# Patient Record
Sex: Female | Born: 1945 | Race: White | Hispanic: No | Smoking: Former smoker
Health system: Southern US, Community
[De-identification: ages and names within clinical notes are randomized; demographics above are authoritative.]

## PROBLEM LIST (undated history)

## (undated) DIAGNOSIS — M810 Age-related osteoporosis without current pathological fracture: Secondary | ICD-10-CM

## (undated) DIAGNOSIS — F419 Anxiety disorder, unspecified: Secondary | ICD-10-CM

## (undated) DIAGNOSIS — E049 Nontoxic goiter, unspecified: Secondary | ICD-10-CM

## (undated) DIAGNOSIS — I10 Essential (primary) hypertension: Secondary | ICD-10-CM

## (undated) DIAGNOSIS — L409 Psoriasis, unspecified: Secondary | ICD-10-CM

## (undated) DIAGNOSIS — J45909 Unspecified asthma, uncomplicated: Secondary | ICD-10-CM

## (undated) DIAGNOSIS — K649 Unspecified hemorrhoids: Secondary | ICD-10-CM

## (undated) DIAGNOSIS — J449 Chronic obstructive pulmonary disease, unspecified: Secondary | ICD-10-CM

## (undated) DIAGNOSIS — E785 Hyperlipidemia, unspecified: Secondary | ICD-10-CM

## (undated) HISTORY — DX: Unspecified asthma, uncomplicated: J45.909

## (undated) HISTORY — DX: Essential (primary) hypertension: I10

## (undated) HISTORY — DX: Chronic obstructive pulmonary disease, unspecified: J44.9

## (undated) HISTORY — DX: Psoriasis, unspecified: L40.9

## (undated) HISTORY — DX: Nontoxic goiter, unspecified: E04.9

## (undated) HISTORY — DX: Age-related osteoporosis without current pathological fracture: M81.0

## (undated) HISTORY — DX: Hyperlipidemia, unspecified: E78.5

## (undated) HISTORY — DX: Unspecified hemorrhoids: K64.9

## (undated) HISTORY — PX: TONSILLECTOMY: SUR1361

## (undated) HISTORY — DX: Anxiety disorder, unspecified: F41.9

---
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
## 1980-10-01 HISTORY — PX: OTHER SURGICAL HISTORY: SHX169

## 1988-10-01 HISTORY — PX: OTHER SURGICAL HISTORY: SHX169

## 2011-12-01 LAB — BASIC METABOLIC PANEL
BUN: 20 mg/dL (ref 4–21)
Creatinine: 0.8 mg/dL (ref 0.5–1.1)
Glucose: 90 mg/dL
Potassium: 4.5 mmol/L (ref 3.4–5.3)
Sodium: 140 mmol/L (ref 137–147)

## 2011-12-01 LAB — CBC AND DIFFERENTIAL: WBC: 5.2 10^3/mL

## 2011-12-01 LAB — HEPATIC FUNCTION PANEL
ALT: 23 U/L (ref 7–35)
AST: 13 U/L (ref 13–35)

## 2012-06-04 LAB — HEPATIC FUNCTION PANEL
ALT: 21 U/L (ref 7–35)
AST: 12 U/L — AB (ref 13–35)
Alkaline Phosphatase: 66 U/L (ref 25–125)
Bilirubin, Total: 0.5 mg/dL

## 2012-06-04 LAB — CBC AND DIFFERENTIAL
HCT: 44 % (ref 36–46)
WBC: 5.5 10^3/mL

## 2012-06-04 LAB — BASIC METABOLIC PANEL
BUN: 21 mg/dL (ref 4–21)
Glucose: 96 mg/dL
Sodium: 140 mmol/L (ref 137–147)

## 2012-06-04 LAB — LIPID PANEL: LDL Cholesterol: 155 mg/dL

## 2012-06-27 LAB — HM MAMMOGRAPHY

## 2012-12-11 LAB — CBC AND DIFFERENTIAL: Platelets: 232 10*3/uL (ref 150–399)

## 2012-12-11 LAB — HEPATIC FUNCTION PANEL
ALT: 19 U/L (ref 7–35)
AST: 13 U/L (ref 13–35)

## 2012-12-11 LAB — BASIC METABOLIC PANEL
Glucose: 99 mg/dL
Sodium: 143 mmol/L (ref 137–147)

## 2012-12-11 LAB — LIPID PANEL
HDL: 56 mg/dL (ref 35–70)
LDL Cholesterol: 123 mg/dL

## 2013-05-21 ENCOUNTER — Telehealth: Payer: Self-pay | Admitting: Internal Medicine

## 2013-05-21 NOTE — Telephone Encounter (Signed)
rec'd reords from [facility], [facility] [address] to [employer]

## 2013-07-23 ENCOUNTER — Inpatient Hospital Stay (HOSPITAL_COMMUNITY)
Admission: EM | Admit: 2013-07-23 | Discharge: 2013-07-29 | DRG: 189 | Disposition: A | Discharge status: Home / Self Care | Payer: Medicare Other | Attending: Internal Medicine | Admitting: Internal Medicine

## 2013-07-23 ENCOUNTER — Encounter (HOSPITAL_COMMUNITY): Payer: Self-pay | Admitting: Emergency Medicine

## 2013-07-23 ENCOUNTER — Emergency Department (HOSPITAL_COMMUNITY): Payer: Medicare Other

## 2013-07-23 DIAGNOSIS — I50810 Right heart failure, unspecified: Secondary | ICD-10-CM

## 2013-07-23 DIAGNOSIS — F17200 Nicotine dependence, unspecified, uncomplicated: Secondary | ICD-10-CM | POA: Diagnosis present

## 2013-07-23 DIAGNOSIS — F411 Generalized anxiety disorder: Secondary | ICD-10-CM | POA: Diagnosis present

## 2013-07-23 DIAGNOSIS — E785 Hyperlipidemia, unspecified: Secondary | ICD-10-CM | POA: Diagnosis present

## 2013-07-23 DIAGNOSIS — J441 Chronic obstructive pulmonary disease with (acute) exacerbation: Secondary | ICD-10-CM

## 2013-07-23 DIAGNOSIS — J449 Chronic obstructive pulmonary disease, unspecified: Secondary | ICD-10-CM | POA: Diagnosis present

## 2013-07-23 DIAGNOSIS — R0902 Hypoxemia: Secondary | ICD-10-CM

## 2013-07-23 DIAGNOSIS — I279 Pulmonary heart disease, unspecified: Secondary | ICD-10-CM | POA: Diagnosis present

## 2013-07-23 DIAGNOSIS — Z23 Encounter for immunization: Secondary | ICD-10-CM

## 2013-07-23 DIAGNOSIS — J9620 Acute and chronic respiratory failure, unspecified whether with hypoxia or hypercapnia: Principal | ICD-10-CM | POA: Diagnosis present

## 2013-07-23 DIAGNOSIS — R7989 Other specified abnormal findings of blood chemistry: Secondary | ICD-10-CM | POA: Diagnosis present

## 2013-07-23 DIAGNOSIS — M539 Dorsopathy, unspecified: Secondary | ICD-10-CM | POA: Diagnosis present

## 2013-07-23 DIAGNOSIS — I2781 Cor pulmonale (chronic): Secondary | ICD-10-CM | POA: Diagnosis present

## 2013-07-23 DIAGNOSIS — I10 Essential (primary) hypertension: Secondary | ICD-10-CM | POA: Diagnosis present

## 2013-07-23 DIAGNOSIS — E872 Acidosis, unspecified: Secondary | ICD-10-CM | POA: Diagnosis present

## 2013-07-23 DIAGNOSIS — E049 Nontoxic goiter, unspecified: Secondary | ICD-10-CM | POA: Diagnosis present

## 2013-07-23 DIAGNOSIS — I2510 Atherosclerotic heart disease of native coronary artery without angina pectoris: Secondary | ICD-10-CM | POA: Diagnosis present

## 2013-07-23 DIAGNOSIS — G8929 Other chronic pain: Secondary | ICD-10-CM | POA: Diagnosis present

## 2013-07-23 DIAGNOSIS — M549 Dorsalgia, unspecified: Secondary | ICD-10-CM | POA: Diagnosis present

## 2013-07-23 DIAGNOSIS — R0689 Other abnormalities of breathing: Secondary | ICD-10-CM

## 2013-07-23 DIAGNOSIS — Z79899 Other long term (current) drug therapy: Secondary | ICD-10-CM

## 2013-07-23 DIAGNOSIS — D45 Polycythemia vera: Secondary | ICD-10-CM | POA: Diagnosis present

## 2013-07-23 DIAGNOSIS — Z7982 Long term (current) use of aspirin: Secondary | ICD-10-CM

## 2013-07-23 DIAGNOSIS — E871 Hypo-osmolality and hyponatremia: Secondary | ICD-10-CM | POA: Diagnosis present

## 2013-07-23 DIAGNOSIS — J9621 Acute and chronic respiratory failure with hypoxia: Secondary | ICD-10-CM | POA: Diagnosis present

## 2013-07-23 LAB — BLOOD GAS, ARTERIAL
Bicarbonate: 26.2 mEq/L — ABNORMAL HIGH (ref 20.0–24.0)
Expiratory PAP: 5
FIO2: 0.6 %
O2 Saturation: 98.7 %
Patient temperature: 98.7
TCO2: 28.1 mmol/L (ref 0–100)
pCO2 arterial: 61.7 mmHg (ref 35.0–45.0)
pH, Arterial: 7.252 — ABNORMAL LOW (ref 7.350–7.450)
pO2, Arterial: 149 mmHg — ABNORMAL HIGH (ref 80.0–100.0)

## 2013-07-23 LAB — CBC WITH DIFFERENTIAL/PLATELET
Basophils Absolute: 0 10*3/uL (ref 0.0–0.1)
Eosinophils Absolute: 0 10*3/uL (ref 0.0–0.7)
HCT: 44.7 % (ref 36.0–46.0)
Lymphs Abs: 1 10*3/uL (ref 0.7–4.0)
MCH: 33.5 pg (ref 26.0–34.0)
MCHC: 36.5 g/dL — ABNORMAL HIGH (ref 30.0–36.0)
MCV: 91.8 fL (ref 78.0–100.0)
Monocytes Absolute: 0.7 10*3/uL (ref 0.1–1.0)
Monocytes Relative: 12 % (ref 3–12)
Neutro Abs: 3.9 10*3/uL (ref 1.7–7.7)
Platelets: 168 10*3/uL (ref 150–400)
RDW: 13.2 % (ref 11.5–15.5)
WBC: 5.6 10*3/uL (ref 4.0–10.5)

## 2013-07-23 LAB — POCT I-STAT 3, ART BLOOD GAS (G3+)
O2 Saturation: 96 %
pCO2 arterial: 78.7 mmHg (ref 35.0–45.0)
pH, Arterial: 7.22 — ABNORMAL LOW (ref 7.350–7.450)
pO2, Arterial: 100 mmHg (ref 80.0–100.0)

## 2013-07-23 LAB — CBC
HCT: 44.5 % (ref 36.0–46.0)
Hemoglobin: 15.5 g/dL — ABNORMAL HIGH (ref 12.0–15.0)
MCH: 32.4 pg (ref 26.0–34.0)
MCV: 93.1 fL (ref 78.0–100.0)
RBC: 4.78 MIL/uL (ref 3.87–5.11)

## 2013-07-23 LAB — TROPONIN I
Troponin I: 0.54 ng/mL (ref <0.30)
Troponin I: 1.07 ng/mL (ref <0.30)

## 2013-07-23 LAB — POCT I-STAT, CHEM 8
BUN: 16 mg/dL (ref 6–23)
Calcium, Ion: 1.1 mmol/L — ABNORMAL LOW (ref 1.13–1.30)
Chloride: 90 mEq/L — ABNORMAL LOW (ref 96–112)
Glucose, Bld: 117 mg/dL — ABNORMAL HIGH (ref 70–99)
HCT: 53 % — ABNORMAL HIGH (ref 36.0–46.0)
Potassium: 4 mEq/L (ref 3.5–5.1)
TCO2: 31 mmol/L (ref 0–100)

## 2013-07-23 LAB — MRSA PCR SCREENING: MRSA by PCR: NEGATIVE

## 2013-07-23 MED ORDER — ALUM & MAG HYDROXIDE-SIMETH 200-200-20 MG/5ML PO SUSP: 30.0000 mL | Freq: Four times a day (QID) | ORAL | Status: DC | PRN

## 2013-07-23 MED ORDER — PREDNISONE 20 MG PO TABS: 60.0000 mg | ORAL_TABLET | Freq: Once | ORAL | Status: DC

## 2013-07-23 MED ORDER — ENOXAPARIN SODIUM 40 MG/0.4ML ~~LOC~~ SOLN
40.0000 mg | SUBCUTANEOUS | Status: DC
  Administered 2013-07-23: 40 mg via SUBCUTANEOUS
  Filled 2013-07-23: qty 0.4

## 2013-07-23 MED ORDER — ALBUTEROL (5 MG/ML) CONTINUOUS INHALATION SOLN
10.0000 mg/h | INHALATION_SOLUTION | Freq: Once | RESPIRATORY_TRACT | Status: AC
  Administered 2013-07-23: 10 mg/h via RESPIRATORY_TRACT
  Filled 2013-07-23: qty 20

## 2013-07-23 MED ORDER — ALBUTEROL SULFATE (5 MG/ML) 0.5% IN NEBU
5.0000 mg | INHALATION_SOLUTION | RESPIRATORY_TRACT | Status: DC
  Administered 2013-07-23 (×2): 5 mg via RESPIRATORY_TRACT
  Filled 2013-07-23: qty 1

## 2013-07-23 MED ORDER — ALBUTEROL SULFATE (5 MG/ML) 0.5% IN NEBU: 2.5000 mg | INHALATION_SOLUTION | RESPIRATORY_TRACT | Status: DC | PRN

## 2013-07-23 MED ORDER — ALBUTEROL SULFATE (5 MG/ML) 0.5% IN NEBU: 2.5000 mg | INHALATION_SOLUTION | RESPIRATORY_TRACT | Status: DC

## 2013-07-23 MED ORDER — ALBUTEROL SULFATE (5 MG/ML) 0.5% IN NEBU
2.5000 mg | INHALATION_SOLUTION | RESPIRATORY_TRACT | Status: DC
  Filled 2013-07-23: qty 0.5

## 2013-07-23 MED ORDER — SODIUM CHLORIDE 0.9 % IV SOLN
INTRAVENOUS | Status: AC
  Administered 2013-07-23: 16:00:00 via INTRAVENOUS

## 2013-07-23 MED ORDER — ONDANSETRON HCL 4 MG/2ML IJ SOLN: 4.0000 mg | Freq: Three times a day (TID) | INTRAMUSCULAR | Status: DC | PRN

## 2013-07-23 MED ORDER — ACETAMINOPHEN 650 MG RE SUPP: 650.0000 mg | Freq: Four times a day (QID) | RECTAL | Status: DC | PRN

## 2013-07-23 MED ORDER — ASPIRIN 300 MG RE SUPP
300.0000 mg | Freq: Every day | RECTAL | Status: DC
  Administered 2013-07-23: 300 mg via RECTAL
  Filled 2013-07-23 (×2): qty 1

## 2013-07-23 MED ORDER — ACETAMINOPHEN 325 MG PO TABS: 650.0000 mg | ORAL_TABLET | Freq: Four times a day (QID) | ORAL | Status: DC | PRN

## 2013-07-23 MED ORDER — IPRATROPIUM BROMIDE 0.02 % IN SOLN: 0.5000 mg | RESPIRATORY_TRACT | Status: DC

## 2013-07-23 MED ORDER — DEXTROSE 5 % IV SOLN
500.0000 mg | INTRAVENOUS | Status: DC
  Administered 2013-07-24 – 2013-07-25 (×2): 500 mg via INTRAVENOUS
  Filled 2013-07-23 (×3): qty 500

## 2013-07-23 MED ORDER — IPRATROPIUM BROMIDE 0.02 % IN SOLN
0.5000 mg | RESPIRATORY_TRACT | Status: DC
  Filled 2013-07-23: qty 2.5

## 2013-07-23 MED ORDER — METHYLPREDNISOLONE SODIUM SUCC 125 MG IJ SOLR
125.0000 mg | Freq: Once | INTRAMUSCULAR | Status: AC
  Administered 2013-07-23: 125 mg via INTRAVENOUS
  Filled 2013-07-23: qty 2

## 2013-07-23 MED ORDER — METHYLPREDNISOLONE SODIUM SUCC 125 MG IJ SOLR
80.0000 mg | Freq: Four times a day (QID) | INTRAMUSCULAR | Status: DC
  Filled 2013-07-23 (×3): qty 1.28

## 2013-07-23 MED ORDER — ONDANSETRON HCL 4 MG/2ML IJ SOLN: 4.0000 mg | Freq: Four times a day (QID) | INTRAMUSCULAR | Status: DC | PRN

## 2013-07-23 MED ORDER — HEPARIN (PORCINE) IN NACL 100-0.45 UNIT/ML-% IJ SOLN
850.0000 [IU]/h | INTRAMUSCULAR | Status: DC
  Administered 2013-07-23: 850 [IU]/h via INTRAVENOUS
  Filled 2013-07-23 (×2): qty 250

## 2013-07-23 MED ORDER — METHYLPREDNISOLONE SODIUM SUCC 125 MG IJ SOLR
80.0000 mg | Freq: Four times a day (QID) | INTRAMUSCULAR | Status: DC
  Administered 2013-07-23 – 2013-07-25 (×8): 80 mg via INTRAVENOUS
  Filled 2013-07-23 (×8): qty 1.28

## 2013-07-23 MED ORDER — SENNOSIDES-DOCUSATE SODIUM 8.6-50 MG PO TABS
1.0000 | ORAL_TABLET | Freq: Every evening | ORAL | Status: DC | PRN
  Filled 2013-07-23: qty 1

## 2013-07-23 MED ORDER — HEPARIN BOLUS VIA INFUSION
1500.0000 [IU] | Freq: Once | INTRAVENOUS | Status: AC
  Administered 2013-07-23: 1500 [IU] via INTRAVENOUS
  Filled 2013-07-23: qty 1500

## 2013-07-23 MED ORDER — DEXTROSE 5 % IV SOLN
1.0000 g | Freq: Once | INTRAVENOUS | Status: AC
  Administered 2013-07-23: 1 g via INTRAVENOUS
  Filled 2013-07-23: qty 10

## 2013-07-23 MED ORDER — GUAIFENESIN ER 600 MG PO TB12
1200.0000 mg | ORAL_TABLET | Freq: Two times a day (BID) | ORAL | Status: DC
  Administered 2013-07-23 – 2013-07-27 (×8): 1200 mg via ORAL
  Filled 2013-07-23 (×9): qty 2

## 2013-07-23 MED ORDER — ONDANSETRON HCL 4 MG PO TABS: 4.0000 mg | ORAL_TABLET | Freq: Four times a day (QID) | ORAL | Status: DC | PRN

## 2013-07-23 MED ORDER — IPRATROPIUM BROMIDE 0.02 % IN SOLN
0.5000 mg | RESPIRATORY_TRACT | Status: DC
  Administered 2013-07-23 (×2): 0.5 mg via RESPIRATORY_TRACT
  Filled 2013-07-23: qty 2.5

## 2013-07-23 MED ORDER — AZITHROMYCIN 250 MG PO TABS
500.0000 mg | ORAL_TABLET | Freq: Once | ORAL | Status: AC
  Administered 2013-07-23: 500 mg via ORAL
  Filled 2013-07-23: qty 2

## 2013-07-23 MED ORDER — SODIUM CHLORIDE 0.9 % IJ SOLN
3.0000 mL | Freq: Two times a day (BID) | INTRAMUSCULAR | Status: DC
  Administered 2013-07-24 – 2013-07-28 (×9): 3 mL via INTRAVENOUS

## 2013-07-23 MED ORDER — IPRATROPIUM BROMIDE 0.02 % IN SOLN: 0.5000 mg | RESPIRATORY_TRACT | Status: DC | PRN

## 2013-07-23 MED ORDER — ASPIRIN EC 325 MG PO TBEC: 325.0000 mg | DELAYED_RELEASE_TABLET | Freq: Every day | ORAL | Status: DC

## 2013-07-23 NOTE — ED Notes (Signed)
Pt placed on a venturi mask at 9 liters with 35 % O2. Morgan Doom, PA at the bedside. Pt denying any pain except in her lungs

## 2013-07-23 NOTE — Progress Notes (Signed)
ANTICOAGULATION CONSULT NOTE - Initial Consult  Pharmacy Consult for Heparin Indication: chest pain/ACS  Allergies  Allergen Reactions  . Bee Venom     Bee sting  . Levaquin [Levofloxacin In D5w] Other (See Comments)    unknown    Patient Measurements: Height: 5\' 6"  (167.6 cm) Weight: 154 lb 5.2 oz (70 kg) IBW/kg (Calculated) : 59.3 Heparin Dosing Weight: 70kg  Vital Signs: Temp: 98.7 F (37.1 C) (10/23 1507) Temp src: Oral (10/23 1507) BP: 132/72 mmHg (10/23 1609) Pulse Rate: 120 (10/23 1609)  Labs:  Recent Labs  07/23/13 1139 07/23/13 1146 07/23/13 1716  HGB 16.3* 18.0* 15.5*  HCT 44.7 53.0* 44.5  PLT 168  --  166  CREATININE  --  0.90 0.65  TROPONINI  --   --  0.54*    Estimated Creatinine Clearance: 63 ml/min (by C-G formula based on Cr of 0.65).   Medical History: Past Medical History  Diagnosis Date  . Asthma   . History of chicken pox   . Back problem   . Goiter   . Hemorrhoids   . COPD (chronic obstructive pulmonary disease)   . Hypertension   . Cardiovascular disease   . Hyperlipidemia   . Psoriasis     Medications:  Prescriptions prior to admission  Medication Sig Dispense Refill  . albuterol (PROVENTIL HFA;VENTOLIN HFA) 108 (90 BASE) MCG/ACT inhaler Inhale 2 puffs into the lungs every 4 (four) hours as needed for wheezing.      Marland Kitchen EPINEPHrine (EPI-PEN) 0.3 mg/0.3 mL SOAJ injection Inject 0.3 mg into the muscle once.      . Fluticasone-Salmeterol (ADVAIR) 500-50 MCG/DOSE AEPB Inhale 1 puff into the lungs every 12 (twelve) hours.      Marland Kitchen lisinopril (PRINIVIL,ZESTRIL) 20 MG tablet Take 20 mg by mouth daily.      . Tetrahydrozoline HCl (EYE DROPS OP) Place 1 drop into both eyes daily as needed (dry eyes).        Assessment: 67yof to begin heparin for elevated troponin/ACS. Patient received Lovenox 40mg  SQ ~1630 - will give reduced bolus. - H/H and Plts wnl - No significant bleeding reported - Baseline INR ordered  Goal of Therapy:   Heparin level 0.3-0.7 units/ml Monitor platelets by anticoagulation protocol: Yes   Plan:  1. Discontinue Lovenox 2. Heparin IV bolus 1500 units x 1 3. Heparin drip 850 units/hr (8.5 ml/hr) 4. Check heparin level, INR and CBC at 0300 - consider Lovenox effects when interpreting level 5. Daily heparin level and CBC  Cleon Dew [phone] 07/23/2013,7:32 PM

## 2013-07-23 NOTE — Progress Notes (Signed)
Utilization review completed.  

## 2013-07-23 NOTE — Progress Notes (Signed)
Troponins elevated, EKG-sinus tach-spoke with Dr Jearld Pies- Cards on Call- to monitor and notify cards if enzymes continue to increase. In interim, will start ASA and Heparin.

## 2013-07-23 NOTE — Progress Notes (Addendum)
Shift event: Pt's 1st troponin .54, 2nd 1.07. Pt is having NO chest pain and is already on ASA and Heparin IV. Cardio fellow called to make aware. No change in tx plan at this time. Will report to oncoming am attending. Craige Cotta, NP 3rd trop down to .95. Craige Cotta, NP

## 2013-07-23 NOTE — ED Notes (Signed)
Dr. Belfi at the bedside.  

## 2013-07-23 NOTE — ED Notes (Signed)
RT called for a CAT

## 2013-07-23 NOTE — ED Notes (Signed)
Admitting at the bedside.  

## 2013-07-23 NOTE — ED Notes (Signed)
Pt finished with 2nd breathing treatment. States "it's not getting any better". MD made aware and will be back in to see patient shortly. Pt placed back on venturi mask at 15 liters/ 50 %. Sats 92 %

## 2013-07-23 NOTE — H&P (Signed)
[facility] History and Physical  Morgan [name] ZOX:[id number] DOB: 08-18-1946 DOA: 07/23/2013  Referring physician: Dr. Fredderick Phenix, EDP PCP: Rene Paci, MD    Chief Complaint: short of breath  HPI: Morgan [name] is a 67 y.o. female with a past medical history significant for COPD, continued tobacco abuse, cardiovascular disease, hypertension, hyperlipidemia, psoriasis.  She presents with acute respiratory distress and is currently on a venturi mask.  She is short of breath when speaking. She reports that her granddaughter was visiting her recently and had an upper respiratory illness.  The patient has been having progressively worsening shortness of breath over the past 3 days. She has a nonproductive cough. She reports that she quit smoking when this illness started. She has had previous COPD exacerbations, but none that required her to come to the emergency department. She is compliant in taking albuterol and Advair. She is new to town and has not yet seen her new primary care physician, Dr. [name].    The patient received 2 breathing treatments in the emergency department with no improvement.  Her chest x-ray is consistent with emphysema/COPD. Current hemoglobin is 18 and hematocrit is 53.    She is being admitted to the step down unit for acute on chronic respiratory failure with hypoxia (she had an O2 sat of 75 in the ED). An ABG is pending. She is a full code.   Systems: Patient denies chest pain, hemoptysis, sore throat, anorexia, fever, vomiting, diarrhea. Further review of systems was difficult to obtain as the patient was so short of breath.  Past Medical History  Diagnosis Date  . Asthma   . History of chicken pox   . Back problem   . Goiter   . Hemorrhoids   . COPD (chronic obstructive pulmonary disease)   . Hypertension   . Cardiovascular disease   . Hyperlipidemia   . Psoriasis    Past Surgical History  Procedure Laterality Date  . Cesarean section     Social  History:  reports that she quit smoking 8 days ago. She does not have any smokeless tobacco history on file. She reports that she drinks alcohol. She reports that she does not use illicit drugs. She lives at home, and is independent with her ADLs  Allergies  Allergen Reactions  . Bee Venom     Bee sting  . Levaquin [Levofloxacin In D5w] Other (See Comments)    unknown    Family History  Problem Relation Age of Onset  . Heart disease Mother   . Cancer Father    no known pulmonary difficulties in the family  Prior to Admission medications   Medication Sig Start Date End Date Taking? Authorizing Provider  albuterol (PROVENTIL HFA;VENTOLIN HFA) 108 (90 BASE) MCG/ACT inhaler Inhale 2 puffs into the lungs every 4 (four) hours as needed for wheezing.   Yes Historical Provider, MD  EPINEPHrine (EPI-PEN) 0.3 mg/0.3 mL SOAJ injection Inject 0.3 mg into the muscle once.   Yes Historical Provider, MD  Fluticasone-Salmeterol (ADVAIR) 500-50 MCG/DOSE AEPB Inhale 1 puff into the lungs every 12 (twelve) hours.   Yes Historical Provider, MD  lisinopril (PRINIVIL,ZESTRIL) 20 MG tablet Take 20 mg by mouth daily.   Yes Historical Provider, MD  Tetrahydrozoline HCl (EYE DROPS OP) Place 1 drop into both eyes daily as needed (dry eyes).   Yes Historical Provider, MD   Physical Exam: Filed Vitals:   07/23/13 1315  BP: 139/64  Pulse: 120  Temp:   Resp: 21  General:  alert and oriented, sitting up at 90, working to breathe   Eyes: Sclera clear conjunctiva pain, pupils equal and round   ENT: moist his membranes, no exudates or erythema in her oropharynx  Neck: Supple, without lymphadenopathy  Cardiovascular: Tachycardic, without obvious murmur rub or gallop   Respiratory: Diffuse wheezes bilaterally, poor air movement, positive accessory muscle use   Abdomen: Soft, nontender, nondistended, positive bowel sounds, no masses   Skin: few small petechial areas on her extremities no cuts or  bruises.   Musculoskeletal: able to move all 4 extremities, 5 over 5 strength in knee touch  Psychiatric: Alert and oriented, cooperative, well-groomed   Neurologic: cranial nerves II through XII grossly intact, no focal deficits   Labs on Admission:  Basic Metabolic Panel:  Recent Labs Lab 07/23/13 1146  NA 133*  K 4.0  CL 90*  GLUCOSE 117*  BUN 16  CREATININE 0.90   CBC:  Recent Labs Lab 07/23/13 1139 07/23/13 1146  WBC 5.6  --   NEUTROABS 3.9  --   HGB 16.3* 18.0*  HCT 44.7 53.0*  MCV 91.8  --   PLT 168  --     Radiological Exams on Admission: Dg Chest Portable 1 View  07/23/2013   CLINICAL DATA:  Shortness of breath.  EXAM: PORTABLE CHEST - 1 VIEW  COMPARISON:  None.  FINDINGS: 1145 hr. The heart size and mediastinal contours are normal. There is aortic atherosclerosis. The lungs demonstrate diffusely increased interstitial markings. There is a questionable small perihilar nodule on the left, although this could be secondary to overlap of vascular and osseous structures. No confluent airspace opacity or significant pleural effusion is seen.  IMPRESSION: Interstitial prominence without definite edema or focal airspace disease. A small nodule in the left perihilar region cannot be excluded by this examination. Follow up PA and lateral views of the chest are recommended. If this is persistent, chest CT would be indicated for further evaluation.   Electronically Signed   By: Roxy Horseman M.D.   On: 07/23/2013 12:16    EKG: Ordered   Assessment/Plan Principal Problem:   Acute and chronic respiratory failure with hypoxia Active Problems:   Hyperlipidemia   Cardiovascular disease   Hypertension   COPD (chronic obstructive pulmonary disease)   Back problem  Acute on chronic respiratory failure with hypoxia secondary to COPD exacerbation   admitted to step down.  Started on IV Solu-Medrol, scheduled nebulizers, and azithromycin, and mucolytics  May very well need  BiPAP, ABG pending  Reports that she has stopped smoking.  Consider repeat xray after she is hydrated to rule out infiltrate.  Dehydration  Elevated hemoglobin and hematocrit, may be due to dose dehydration and long-term cigarette smoking  Gentle IV fluids for 12 hours then reassess  Hypertension  Continue lisinopril and monitor   Code Status: full code Family Communication: Friend at bedside Disposition Plan: inpatient, stepdown.  Time spent: 60 min.  Conley Canal [facility] Pager [phone]  If 7PM-7AM, please contact night-coverage [email or web address] Password [facility] 07/23/2013, 2:39 PM  Attending - Patient with acute hypercarbic respiratory failure in a setting of acute exacerbation of COPD. ABG is consistent with acute hypocarbia with respiratory acidosis. Start on BiPAP, repeat ABG, PCCM consulted. Continue with Solu-Medrol, nebulized bronchodilators and IV antibiotics. If repeat ABG shows worsening hypercarbia, will likely need to be moved to the intensive care unit. We'll continue to monitor and follow clinical course very closely.  S Ghimire

## 2013-07-23 NOTE — ED Notes (Signed)
RT at the bedside to get an ABG

## 2013-07-23 NOTE — ED Notes (Signed)
[place], PA back in with pt.  

## 2013-07-23 NOTE — ED Provider Notes (Signed)
CSN: [id number]     Arrival date & time 07/23/13  1110 History   First MD Initiated Contact with Patient 07/23/13 1126     Chief Complaint  Patient presents with  . Shortness of Breath   (Consider location/radiation/quality/duration/timing/severity/associated sxs/prior Treatment) HPI Comments: Patient presents with shortness of breath. She has a history of COPD and has had worsening shortness of breath the last 2 or 3 days. She has had a cough productive of yellow sputum. She denies any chest pain. She denies any leg pain or swelling. She denies any fevers or chills. She uses an albuterol inhaler and Advair at home which she's been using without relief. She's not currently on home oxygen.  Patient is a 67 y.o. female presenting with shortness of breath.  Shortness of Breath Associated symptoms: cough   Associated symptoms: no abdominal pain, no chest pain, no diaphoresis, no fever, no headaches, no rash and no vomiting     Past Medical History  Diagnosis Date  . Asthma   . History of chicken pox   . Back problem   . Goiter   . Hemorrhoids   . COPD (chronic obstructive pulmonary disease)   . Hypertension   . Cardiovascular disease   . Hyperlipidemia   . Psoriasis    Past Surgical History  Procedure Laterality Date  . Cesarean section     Family History  Problem Relation Age of Onset  . Heart disease Mother   . Cancer Father    History  Substance Use Topics  . Smoking status: Former Smoker    Quit date: 07/15/2013  . Smokeless tobacco: Not on file     Comment: Maritial Status: Divorced, 2 children  . Alcohol Use: Yes     Comment: states quit smoking last week   OB History   Grav Para Term Preterm Abortions TAB SAB Ect Mult Living                 Review of Systems  Constitutional: Positive for fatigue. Negative for fever, chills and diaphoresis.  HENT: Negative for congestion, rhinorrhea and sneezing.   Eyes: Negative.   Respiratory: Positive for cough and  shortness of breath. Negative for chest tightness.   Cardiovascular: Negative for chest pain and leg swelling.  Gastrointestinal: Negative for nausea, vomiting, abdominal pain, diarrhea and blood in stool.  Genitourinary: Negative for frequency, hematuria, flank pain and difficulty urinating.  Musculoskeletal: Negative for arthralgias and back pain.  Skin: Negative for rash.  Neurological: Negative for dizziness, speech difficulty, weakness, numbness and headaches.    Allergies  Bee venom and Levaquin  Home Medications   Current Outpatient Rx  Name  Route  Sig  Dispense  Refill  . albuterol (PROVENTIL HFA;VENTOLIN HFA) 108 (90 BASE) MCG/ACT inhaler   Inhalation   Inhale 2 puffs into the lungs every 4 (four) hours as needed for wheezing.         Marland Kitchen EPINEPHrine (EPI-PEN) 0.3 mg/0.3 mL SOAJ injection   Intramuscular   Inject 0.3 mg into the muscle once.         . Fluticasone-Salmeterol (ADVAIR) 500-50 MCG/DOSE AEPB   Inhalation   Inhale 1 puff into the lungs every 12 (twelve) hours.         Marland Kitchen lisinopril (PRINIVIL,ZESTRIL) 20 MG tablet   Oral   Take 20 mg by mouth daily.         . Tetrahydrozoline HCl (EYE DROPS OP)   Both Eyes   Place 1 drop into  both eyes daily as needed (dry eyes).          BP 139/64  Pulse 120  Temp(Src) 98.3 F (36.8 C)  Resp 21  SpO2 94% Physical Exam  Constitutional: She is oriented to person, place, and time. She appears well-developed and well-nourished.  HENT:  Head: Normocephalic and atraumatic.  Mouth/Throat: Oropharynx is clear and moist.  Eyes: Pupils are equal, round, and reactive to light.  Neck: Normal range of motion. Neck supple.  Cardiovascular: Normal rate, regular rhythm and normal heart sounds.   Pulmonary/Chest: She is in respiratory distress. She has wheezes. She has no rales. She exhibits no tenderness.  Patient is talking in very short sentences. She has increased work of breathing and decreased breath sounds  bilaterally  Abdominal: Soft. Bowel sounds are normal. There is no tenderness. There is no rebound and no guarding.  Musculoskeletal: Normal range of motion. She exhibits no edema.  Lymphadenopathy:    She has no cervical adenopathy.  Neurological: She is alert and oriented to person, place, and time.  Skin: Skin is warm and dry. No rash noted.  Psychiatric: She has a normal mood and affect.    ED Course  Procedures (including critical care time) Labs Review Labs Reviewed  CBC WITH DIFFERENTIAL - Abnormal; Notable for the following:    Hemoglobin 16.3 (*)    MCHC 36.5 (*)    All other components within normal limits  POCT I-STAT, CHEM 8 - Abnormal; Notable for the following:    Sodium 133 (*)    Chloride 90 (*)    Glucose, Bld 117 (*)    Calcium, Ion 1.10 (*)    Hemoglobin 18.0 (*)    HCT 53.0 (*)    All other components within normal limits  BLOOD GAS, ARTERIAL  CBC  CREATININE, SERUM  TROPONIN I  TROPONIN I  TROPONIN I  POCT I-STAT TROPONIN I   Imaging Review Dg Chest Portable 1 View  07/23/2013   CLINICAL DATA:  Shortness of breath.  EXAM: PORTABLE CHEST - 1 VIEW  COMPARISON:  None.  FINDINGS: 1145 hr. The heart size and mediastinal contours are normal. There is aortic atherosclerosis. The lungs demonstrate diffusely increased interstitial markings. There is a questionable small perihilar nodule on the left, although this could be secondary to overlap of vascular and osseous structures. No confluent airspace opacity or significant pleural effusion is seen.  IMPRESSION: Interstitial prominence without definite edema or focal airspace disease. A small nodule in the left perihilar region cannot be excluded by this examination. Follow up PA and lateral views of the chest are recommended. If this is persistent, chest CT would be indicated for further evaluation.   Electronically Signed   By: Roxy Horseman M.D.   On: 07/23/2013 12:16    EKG Interpretation   None       Date:  07/23/2013  Rate: 112  Rhythm: sinus tachycardia  QRS Axis: normal  Intervals: normal  ST/T Wave abnormalities: nonspecific ST/T changes  Conduction Disutrbances:none  Narrative Interpretation:   Old EKG Reviewed: unchanged   MDM   1. COPD with acute exacerbation   2. Hyperlipidemia   3. Cardiovascular disease   4. Hypertension   5. COPD (chronic obstructive pulmonary disease)   6. Back problem    Patient came in with an initial oxygen saturation 75% on room air. She was started on nebulizer treatments and given Solu-Medrol. She did start to improve but still has significant work of breathing and was  started on continuous albuterol nebulizer treatments. Her chest x-ray did not show pneumonia. She's currently improving and talking better but she still has increased work of breathing. She is alert and maintaining her airway. I discussed with the hospitalist who will admit the patient to the step down unit. She was also given IV antibiotics.  CRITICAL CARE Performed by: Lyndy Russman Total critical care time: 60 Critical care time was exclusive of separately billable procedures and treating other patients. Critical care was necessary to treat or prevent imminent or life-threatening deterioration. Critical care was time spent personally by me on the following activities: development of treatment plan with patient and/or surrogate as well as nursing, discussions with consultants, evaluation of patient's response to treatment, examination of patient, obtaining history from patient or surrogate, ordering and performing treatments and interventions, ordering and review of laboratory studies, ordering and review of radiographic studies, pulse oximetry and re-evaluation of patient's condition.     Rolan Bucco, MD 07/23/13 1425

## 2013-07-23 NOTE — Consult Note (Signed)
PULMONARY  / CRITICAL CARE MEDICINE  Name: Morgan [name] MRN: [id number] DOB: 05/23/1946    ADMISSION DATE:  07/23/2013 CONSULTATION DATE:  10/23  REFERRING MD :  Jerral Ralph PRIMARY SERVICE: [facility]   CHIEF COMPLAINT:  COPD, Resp failure   BRIEF PATIENT DESCRIPTION: 67yo female active smoker with hx COPD admitted 10/23 by [facility] for AECOPD.  Respiratory status cont to decline with acute hypercarbic respiratory failure requiring Bipap and PCCM consulted.    SIGNIFICANT EVENTS / STUDIES:  10/23>> admitted, bipap   LINES / TUBES:   CULTURES: Sputum 10/23>>>  ANTIBIOTICS: Rocephin 10/23>>>10/23 Azithro 10/23>>>10/23  HISTORY OF PRESENT ILLNESS:  67yo female active smoker with hx COPD admitted 10/23 by [facility] for AECOPD. Presented with 3 day hx SOB, nonproductive cough.  She moved here recently and has not yet seen her new PCP [name]). Her granddaughter was sick recently with URI. In ER sats were 75%.  ABG 7.22/78/100/32.  She was placed on bipap and PCCM consulted.  Denies fevers, chest pain, hemoptysis, edema, PND, orthopnea.  Has had AECOPD in the past but has never required hospitalization for this.   PAST MEDICAL HISTORY :  Past Medical History  Diagnosis Date  . Asthma   . History of chicken pox   . Back problem   . Goiter   . Hemorrhoids   . COPD (chronic obstructive pulmonary disease)   . Hypertension   . Cardiovascular disease   . Hyperlipidemia   . Psoriasis    Past Surgical History  Procedure Laterality Date  . Cesarean section     Prior to Admission medications   Medication Sig Start Date End Date Taking? Authorizing Provider  albuterol (PROVENTIL HFA;VENTOLIN HFA) 108 (90 BASE) MCG/ACT inhaler Inhale 2 puffs into the lungs every 4 (four) hours as needed for wheezing.   Yes Historical Provider, MD  EPINEPHrine (EPI-PEN) 0.3 mg/0.3 mL SOAJ injection Inject 0.3 mg into the muscle once.   Yes Historical Provider, MD  Fluticasone-Salmeterol (ADVAIR) 500-50 MCG/DOSE  AEPB Inhale 1 puff into the lungs every 12 (twelve) hours.   Yes Historical Provider, MD  lisinopril (PRINIVIL,ZESTRIL) 20 MG tablet Take 20 mg by mouth daily.   Yes Historical Provider, MD  Tetrahydrozoline HCl (EYE DROPS OP) Place 1 drop into both eyes daily as needed (dry eyes).   Yes Historical Provider, MD   Allergies  Allergen Reactions  . Bee Venom     Bee sting  . Levaquin [Levofloxacin In D5w] Other (See Comments)    unknown    FAMILY HISTORY:  Family History  Problem Relation Age of Onset  . Heart disease Mother   . Cancer Father    SOCIAL HISTORY:  reports that she quit smoking 8 days ago. She does not have any smokeless tobacco history on file. She reports that she drinks alcohol. She reports that she does not use illicit drugs.  REVIEW OF SYSTEMS:  As per HPI - all other systems reviewed and were neg.   SUBJECTIVE:   VITAL SIGNS: Temp:  [98.3 F (36.8 C)-98.7 F (37.1 C)] 98.7 F (37.1 C) (10/23 1507) Pulse Rate:  [114-133] 120 (10/23 1609) Resp:  [19-28] 20 (10/23 1609) BP: (132-180)/(64-159) 132/72 mmHg (10/23 1609) SpO2:  [75 %-100 %] 93 % (10/23 1609) FiO2 (%):  [35 %-50 %] 50 % (10/23 1258) Weight:  [154 lb 5.2 oz (70 kg)] 154 lb 5.2 oz (70 kg) (10/23 1507) HEMODYNAMICS:   VENTILATOR SETTINGS: Vent Mode:  [-]  FiO2 (%):  [35 %-50 %]  50 % INTAKE / OUTPUT: Intake/Output     10/22 0701 - 10/23 0700 10/23 0701 - 10/24 0700   Total Intake(mL/kg)     Net   0         PHYSICAL EXAMINATION: General:  Chronically ill appearing female, NAD on bipap  Neuro:  Awake, alert, attempts to answer questions, MAE  HEENT:  Mm dry, bipap, no JVD Cardiovascular:  s1s2 rrr, mild tachy Lungs:  resps even, mildly labored on bipap, diminished throughout, few scattered exp wheeze  Abdomen:  Soft, +bs Musculoskeletal:  Warm and dry, no edema  LABS:  CBC Recent Labs     07/23/13  1139  07/23/13  1146  WBC  5.6   --   HGB  16.3*  18.0*  HCT  44.7  53.0*  PLT  168    --    Coag's No results found for this basename: APTT, INR,  in the last 72 hours BMET Recent Labs     07/23/13  1146  NA  133*  K  4.0  CL  90*  BUN  16  CREATININE  0.90  GLUCOSE  117*   Electrolytes No results found for this basename: CALCIUM, MG, PHOS,  in the last 72 hours Sepsis Markers No results found for this basename: LACTICACIDVEN, PROCALCITON, O2SATVEN,  in the last 72 hours ABG Recent Labs     07/23/13  1434  PHART  7.220*  PCO2ART  78.7*  PO2ART  100.0   Liver Enzymes No results found for this basename: AST, ALT, ALKPHOS, BILITOT, ALBUMIN,  in the last 72 hours Cardiac Enzymes No results found for this basename: TROPONINI, PROBNP,  in the last 72 hours Glucose No results found for this basename: GLUCAP,  in the last 72 hours  Imaging Dg Chest Portable 1 View  07/23/2013   CLINICAL DATA:  Shortness of breath.  EXAM: PORTABLE CHEST - 1 VIEW  COMPARISON:  None.  FINDINGS: 1145 hr. The heart size and mediastinal contours are normal. There is aortic atherosclerosis. The lungs demonstrate diffusely increased interstitial markings. There is a questionable small perihilar nodule on the left, although this could be secondary to overlap of vascular and osseous structures. No confluent airspace opacity or significant pleural effusion is seen.  IMPRESSION: Interstitial prominence without definite edema or focal airspace disease. A small nodule in the left perihilar region cannot be excluded by this examination. Follow up PA and lateral views of the chest are recommended. If this is persistent, chest CT would be indicated for further evaluation.   Electronically Signed   By: Roxy Horseman M.D.   On: 07/23/2013 12:16     ASSESSMENT / PLAN:  PULMONARY Acute respiratory failure  COPD  Active smoker  P:   Cont bipap for now - attempt trial off in 4 hours  F/u ABG on bipap  O2 to keep sats 88-92%  BD  Systemic steroids  pulm hygiene once off bipap  F/u CXR  abx for  AECOPD - allergic quinolones  Smoking cessation Will need outpt pulmonary f/u   CARDIOVASCULAR Hx HTN  CAD  P:  Monitor  Hold home ACE  RENAL Hyponatremia - mild  P:   F/u chem  Gentle fluids   GASTROINTESTINAL No active issue  P:   NPO on bipap   HEMATOLOGIC Polycythemia  P:  F/u cbc  lovenox for dvt proph   INFECTIOUS AECOPD  P:   Empiric abx as above  Sputum culture   ENDOCRINE  No active issue    P:   Monitor glucose on chem   NEUROLOGIC AMS - mild - in setting hypercarbia  P:   Supportive care as above  F/u ABG   WHITEHEART,KATHRYN, NP 07/23/2013  4:24 PM Pager: [phone] or [phone]  Ok to admit to SDU as patient is very comfortable on BiPAP.  Would not repeat ABG at this point and just follow the patient clinically.  BiPAP tonight and can reassess in AM need for BiPAP based on how the patient feels.  Agree with abx and steroid doses as above.  Will f/u in AM.  If worsens clinically will need to be moved to the ICU and intubated.   I have personally obtained a history, examined the patient, evaluated laboratory and imaging results, formulated the assessment and plan and placed orders.  CRITICAL CARE: The patient is critically ill with multiple organ systems failure and requires high complexity decision making for assessment and support, frequent evaluation and titration of therapies, application of advanced monitoring technologies and extensive interpretation of multiple databases. Critical Care Time devoted to patient care services described in this note is 35 minutes.   *Care during the described time interval was provided by me and/or other providers on the critical care team. I have reviewed this patient's available data, including medical history, events of note, physical examination and test results as part of my evaluation.  Morgan [name], M.D. [facility] Pulmonary/Critical Care Medicine. Pager: [phone]. After hours pager:  [phone].

## 2013-07-23 NOTE — ED Notes (Signed)
Sob since Monday using inhaler has had a cough now really sob on exertion states still smokes

## 2013-07-24 DIAGNOSIS — R7989 Other specified abnormal findings of blood chemistry: Secondary | ICD-10-CM

## 2013-07-24 DIAGNOSIS — I369 Nonrheumatic tricuspid valve disorder, unspecified: Secondary | ICD-10-CM

## 2013-07-24 DIAGNOSIS — R0609 Other forms of dyspnea: Secondary | ICD-10-CM

## 2013-07-24 LAB — BLOOD GAS, ARTERIAL
Acid-Base Excess: 4.5 mmol/L — ABNORMAL HIGH (ref 0.0–2.0)
Bicarbonate: 29.9 mEq/L — ABNORMAL HIGH (ref 20.0–24.0)
Delivery systems: POSITIVE
FIO2: 0.4 %
Mode: POSITIVE
O2 Saturation: 97.4 %
Patient temperature: 98.6
RATE: 18 resp/min
TCO2: 31.7 mmol/L (ref 0–100)
pO2, Arterial: 96.6 mmHg (ref 80.0–100.0)

## 2013-07-24 LAB — CBC
Hemoglobin: 14.7 g/dL (ref 12.0–15.0)
MCH: 32.3 pg (ref 26.0–34.0)
RBC: 4.55 MIL/uL (ref 3.87–5.11)
WBC: 3.7 10*3/uL — ABNORMAL LOW (ref 4.0–10.5)

## 2013-07-24 LAB — PROTIME-INR
INR: 1.06 (ref 0.00–1.49)
Prothrombin Time: 13.6 seconds (ref 11.6–15.2)

## 2013-07-24 LAB — BASIC METABOLIC PANEL
BUN: 22 mg/dL (ref 6–23)
CO2: 28 mEq/L (ref 19–32)
Chloride: 94 mEq/L — ABNORMAL LOW (ref 96–112)
GFR calc Af Amer: >90 mL/min (ref >90)
GFR calc non Af Amer: >90 mL/min (ref >90)
Potassium: 4.3 mEq/L (ref 3.5–5.1)
Sodium: 135 mEq/L (ref 135–145)

## 2013-07-24 LAB — HEPARIN LEVEL (UNFRACTIONATED): Heparin Unfractionated: 0.45 IU/mL (ref 0.30–0.70)

## 2013-07-24 LAB — TROPONIN I: Troponin I: 0.95 ng/mL (ref <0.30)

## 2013-07-24 MED ORDER — IPRATROPIUM BROMIDE 0.02 % IN SOLN
0.5000 mg | Freq: Four times a day (QID) | RESPIRATORY_TRACT | Status: DC
  Administered 2013-07-24 – 2013-07-28 (×17): 0.5 mg via RESPIRATORY_TRACT
  Filled 2013-07-24 (×18): qty 2.5

## 2013-07-24 MED ORDER — ASPIRIN 300 MG RE SUPP
300.0000 mg | Freq: Every day | RECTAL | Status: DC
  Administered 2013-07-24: 300 mg via RECTAL
  Filled 2013-07-24 (×2): qty 1

## 2013-07-24 MED ORDER — ALBUTEROL SULFATE (5 MG/ML) 0.5% IN NEBU
2.5000 mg | INHALATION_SOLUTION | RESPIRATORY_TRACT | Status: DC | PRN
  Filled 2013-07-24: qty 0.5

## 2013-07-24 MED ORDER — ASPIRIN 325 MG PO TABS
325.0000 mg | ORAL_TABLET | Freq: Every day | ORAL | Status: DC
  Filled 2013-07-24: qty 1

## 2013-07-24 MED ORDER — ALBUTEROL SULFATE (5 MG/ML) 0.5% IN NEBU
2.5000 mg | INHALATION_SOLUTION | Freq: Four times a day (QID) | RESPIRATORY_TRACT | Status: DC
  Administered 2013-07-24 – 2013-07-28 (×17): 2.5 mg via RESPIRATORY_TRACT
  Filled 2013-07-24 (×17): qty 0.5

## 2013-07-24 NOTE — Progress Notes (Signed)
[facility] Progress Note Morgan [name]   Morgan [name] ZOX:[id number] DOB: Jan 24, 1946 DOA: 07/23/2013 PCP: Rene Paci, MD  Brief narrative: Morgan [name] is a 67 y.o. female with a past medical history significant for COPD, continued tobacco abuse, cardiovascular disease, hypertension, hyperlipidemia, psoriasis. She presents with acute respiratory distress and is currently on a venturi mask. She is short of breath when speaking. She reports that her granddaughter was visiting her recently and had an upper respiratory illness. The patient has been having progressively worsening shortness of breath over the past 3 days. She has a nonproductive cough. She reports that she quit smoking when this illness started. She has had previous COPD exacerbations, but none that required her to come to the emergency department. She is compliant in taking albuterol and Advair. She is new to town and has not yet seen her new primary care physician, Dr. [name].    Assessment/Plan: Principal Problem:   Acute and chronic respiratory failure with hypoxia and hypercarbia  Acute exacerbation of COPD (chronic obstructive pulmonary disease) - also likely has resp infection  - ABG improved- prevent further CO2 retention by keeping pulse ox at 90 - cont current doses of steroids, Nebs, Mucinex and cont Zithro  Active Problems:  Elevated Troponins - per pt no h/o CAD - likely due to resp distress-trending down-  stop Heparin - apparently cardio consult called last night    Chronic back pain - not on pain meds per med-rec    Hypertension - BP low - lisinopril on hold  Nicotine abuse-  - discussed smoking cessation  Dehydration. hyponatremia - cont IVF   Code Status: full code Family Communication: none Disposition Plan: follow in SDU today  Consultants: pulm  Procedures: none  Antibiotics: Zithro 10/23  DVT prophylaxis: Lovenox- d/c heparin  infusion  HPI/Subjective: Feeling much better- c/o cough for a few days- thinks she may have mold in her home    Objective: Blood pressure 119/79, pulse 105, temperature 97.6 F (36.4 C), temperature source Axillary, resp. rate 24, height 5\' 6"  (1.676 m), weight 70 kg (154 lb 5.2 oz), SpO2 95.00%.  Intake/Output Summary (Last 24 hours) at 07/24/13 1742 Last data filed at 07/24/13 1600  Gross per 24 hour  Intake  921.5 ml  Output      0 ml  Net  921.5 ml     Exam: General: No acute respiratory distress Lungs: mild crackles in LLL, no wheezing coarse breath sounds Cardiovascular: Regular rate and rhythm without murmur gallop or rub normal S1 and S2 Abdomen: Nontender, nondistended, soft, bowel sounds positive, no rebound, no ascites, no appreciable mass Extremities: No significant cyanosis, clubbing, or edema bilateral lower extremities  Data Reviewed: Basic Metabolic Panel:  Recent Labs Lab 07/23/13 1146 07/23/13 1716 07/24/13 0445  NA 133*  --  135  K 4.0  --  4.3  CL 90*  --  94*  CO2  --   --  28  GLUCOSE 117*  --  150*  BUN 16  --  22  CREATININE 0.90 0.65 0.60  CALCIUM  --   --  8.8   Liver Function Tests: No results found for this basename: AST, ALT, ALKPHOS, BILITOT, PROT, ALBUMIN,  in the last 168 hours No results found for this basename: LIPASE, AMYLASE,  in the last 168 hours No results found for this basename: AMMONIA,  in the last 168 hours CBC:  Recent Labs Lab 07/23/13 1139 07/23/13 1146 07/23/13 1716 07/24/13  0445  WBC 5.6  --  5.6 3.7*  NEUTROABS 3.9  --   --   --   HGB 16.3* 18.0* 15.5* 14.7  HCT 44.7 53.0* 44.5 42.2  MCV 91.8  --  93.1 92.7  PLT 168  --  166 179   Cardiac Enzymes:  Recent Labs Lab 07/23/13 1716 07/23/13 2010 07/24/13 0445  TROPONINI 0.54* 1.07* 0.95*   BNP (last 3 results) No results found for this basename: PROBNP,  in the last 8760 hours CBG: No results found for this basename: GLUCAP,  in the last 168  hours  Recent Results (from the past 240 hour(s))  MRSA PCR SCREENING     Status: None   Collection Time    07/23/13  3:24 PM      Result Value Range Status   MRSA by PCR NEGATIVE  NEGATIVE Final   Comment:            The GeneXpert MRSA Assay (FDA     approved for NASAL specimens     only), is one component of a     comprehensive MRSA colonization     surveillance program. It is not     intended to diagnose MRSA     infection nor to guide or     monitor treatment for     MRSA infections.     Studies:  Recent x-ray studies have been reviewed in detail by the Attending Physician  Scheduled Meds:  Scheduled Meds: . ipratropium  0.5 mg Nebulization Q6H   And  . albuterol  2.5 mg Nebulization Q6H  . aspirin  300 mg Rectal Daily  . azithromycin  500 mg Intravenous Q24H  . guaiFENesin  1,200 mg Oral BID  . methylPREDNISolone (SOLU-MEDROL) injection  80 mg Intravenous Q6H  . sodium chloride  3 mL Intravenous Q12H   Continuous Infusions: . heparin 850 Units/hr (07/24/13 0600)    Time spent on care of this patient: 35 min   Latroya Ng, MD  [facility] Office  [phone] Pager - Text Page per Loretha Stapler as per below:  On-Call/Text Page:      [email or web address]      password [email or web address]  If 7PM-7AM, please contact night-coverage [email or web address] Password [email or web address] 07/24/2013, 5:42 PM   LOS: 1 day

## 2013-07-24 NOTE — Progress Notes (Signed)
ANTICOAGULATION CONSULT NOTE - Follow Up Consult  Pharmacy Consult for Heparin Indication: chest pain/ACS  Allergies  Allergen Reactions  . Bee Venom     Bee sting  . Levaquin [Levofloxacin In D5w] Other (See Comments)    unknown    Patient Measurements: Height: 5\' 6"  (167.6 cm) Weight: 154 lb 5.2 oz (70 kg) IBW/kg (Calculated) : 59.3 Heparin Dosing Weight: 70 kg  Vital Signs: Temp: 97.4 F (36.3 C) (10/24 1131) Temp src: Axillary (10/24 1131) BP: 114/71 mmHg (10/24 1131) Pulse Rate: 79 (10/24 1221)  Labs:  Recent Labs  07/23/13 1139 07/23/13 1146 07/23/13 1716 07/23/13 2010 07/24/13 0445 07/24/13 1100  HGB 16.3* 18.0* 15.5*  --  14.7  --   HCT 44.7 53.0* 44.5  --  42.2  --   PLT 168  --  166  --  179  --   LABPROT  --   --   --   --  13.6  --   INR  --   --   --   --  1.06  --   HEPARINUNFRC  --   --   --   --  0.55 0.45  CREATININE  --  0.90 0.65  --  0.60  --   TROPONINI  --   --  0.54* 1.07* 0.95*  --     Estimated Creatinine Clearance: 63 ml/min (by C-G formula based on Cr of 0.6).   Medications:  Scheduled:  . aspirin  300 mg Rectal Daily  . azithromycin  500 mg Intravenous Q24H  . guaiFENesin  1,200 mg Oral BID  . methylPREDNISolone (SOLU-MEDROL) injection  80 mg Intravenous Q6H  . sodium chloride  3 mL Intravenous Q12H   Infusions:  . heparin 850 Units/hr (07/24/13 0600)    Assessment: 67 yo F presented with SOB and suspected COPD exacerbation.  Noted to have elevated troponins and started on ASA and heparin.  Heparin level is therapeutic on 850 unit/hr.   Goal of Therapy:  Heparin level 0.3-0.7 units/ml Monitor platelets by anticoagulation protocol: Yes   Plan:  Continue heparin at 850 units/hr. Heparin level and CBC daily.  [employer], Pharm.D., BCPS Clinical Pharmacist Pager [phone] 07/24/2013 1:11 PM

## 2013-07-24 NOTE — Progress Notes (Signed)
ANTICOAGULATION CONSULT NOTE - Follow Up Consult  Pharmacy Consult for heparin Indication: chest pain/ACS  Labs:  Recent Labs  07/23/13 1139 07/23/13 1146 07/23/13 1716 07/23/13 2010 07/24/13 0445  HGB 16.3* 18.0* 15.5*  --  14.7  HCT 44.7 53.0* 44.5  --  42.2  PLT 168  --  166  --  179  LABPROT  --   --   --   --  13.6  INR  --   --   --   --  1.06  HEPARINUNFRC  --   --   --   --  0.55  CREATININE  --  0.90 0.65  --   --   TROPONINI  --   --  0.54* 1.07*  --     Assessment/Plan:  67yo female therapeutic on heparin with initial dosing for ACS.  Will continue gtt at current rate and confirm stable with additional level.  Vernard Gambles, PharmD, BCPS  07/24/2013,5:31 AM

## 2013-07-24 NOTE — Progress Notes (Signed)
  Echocardiogram 2D Echocardiogram has been performed.  Ragina Fenter FRANCES 07/24/2013, 5:19 PM

## 2013-07-24 NOTE — Progress Notes (Signed)
PULMONARY  / CRITICAL CARE MEDICINE  Name: Morgan [name] MRN: [id number] DOB: 1945/11/23    ADMISSION DATE:  07/23/2013 CONSULTATION DATE:  10/23  REFERRING MD :  Jerral Ralph PRIMARY SERVICE: [facility]   CHIEF COMPLAINT:  COPD, Resp failure   BRIEF PATIENT DESCRIPTION: 67yo female active smoker with hx COPD admitted 10/23 by [facility] for AECOPD.  Respiratory status cont to decline with acute hypercarbic respiratory failure requiring Bipap and PCCM consulted.    SIGNIFICANT EVENTS / STUDIES:  10/23>> admitted, bipap   LINES / TUBES:  CULTURES: Sputum 10/23>>>  ANTIBIOTICS: Rocephin 10/23>>>10/23 Azithro 10/23>>>10/23  HISTORY OF PRESENT ILLNESS:  67yo female active smoker with hx COPD admitted 10/23 by [facility] for AECOPD. Presented with 3 day hx SOB, nonproductive cough.  She moved here recently and has not yet seen her new PCP [name]). Her granddaughter was sick recently with URI. In ER sats were 75%.  ABG 7.22/78/100/32.  She was placed on bipap and PCCM consulted.  Denies fevers, chest pain, hemoptysis, edema, PND, orthopnea.  Has had AECOPD in the past but has never required hospitalization for this.   SUBJECTIVE:  She had a break from BiPAP x 1 h earlier this am, has been back on since about 9:30 Comfortable currently, able to converse  VITAL SIGNS: Temp:  [97.4 F (36.3 C)-98.7 F (37.1 C)] 97.4 F (36.3 C) (10/24 1131) Pulse Rate:  [76-133] 79 (10/24 1221) Resp:  [16-29] 16 (10/24 1221) BP: (109-149)/(68-77) 114/71 mmHg (10/24 1131) SpO2:  [84 %-99 %] 97 % (10/24 1221) FiO2 (%):  [40 %] 40 % (10/24 0802) Weight:  [70 kg (154 lb 5.2 oz)] 70 kg (154 lb 5.2 oz) (10/23 1507) HEMODYNAMICS:   VENTILATOR SETTINGS: Vent Mode:  [-]  FiO2 (%):  [40 %] 40 % INTAKE / OUTPUT: Intake/Output     10/23 0701 - 10/24 0700 10/24 0701 - 10/25 0700   P.O.  120   I.V. (mL/kg) 325.3 (4.6) 68 (1)   IV Piggyback  250   Total Intake(mL/kg) 325.3 (4.6) 438 (6.3)   Net +325.3 +438          PHYSICAL EXAMINATION: General:  Chronically ill appearing female, NAD on bipap  Neuro:  Awake, alert, answer questions, MAE  HEENT:  Mm dry, bipap, no JVD Cardiovascular:  s1s2 rrr, mild tachy Lungs:  resps even, distant, no wheezes Abdomen:  Soft, +bs Musculoskeletal:  Warm and dry, no edema  LABS:  CBC Recent Labs     07/23/13  1139  07/23/13  1146  07/23/13  1716  07/24/13  0445  WBC  5.6   --   5.6  3.7*  HGB  16.3*  18.0*  15.5*  14.7  HCT  44.7  53.0*  44.5  42.2  PLT  168   --   166  179   Coag's Recent Labs     07/24/13  0445  INR  1.06   BMET Recent Labs     07/23/13  1146  07/23/13  1716  07/24/13  0445  NA  133*   --   135  K  4.0   --   4.3  CL  90*   --   94*  CO2   --    --   28  BUN  16   --   22  CREATININE  0.90  0.65  0.60  GLUCOSE  117*   --   150*   Electrolytes Recent Labs  07/24/13  0445  CALCIUM  8.8   Sepsis Markers No results found for this basename: LACTICACIDVEN, PROCALCITON, O2SATVEN,  in the last 72 hours ABG Recent Labs     07/23/13  1434  07/23/13  1712  07/24/13  1220  PHART  7.220*  7.252*  7.337*  PCO2ART  78.7*  61.7*  57.4*  PO2ART  100.0  149.0*  96.6   Liver Enzymes No results found for this basename: AST, ALT, ALKPHOS, BILITOT, ALBUMIN,  in the last 72 hours Cardiac Enzymes Recent Labs     07/23/13  1716  07/23/13  2010  07/24/13  0445  TROPONINI  0.54*  1.07*  0.95*   Glucose No results found for this basename: GLUCAP,  in the last 72 hours  Imaging Dg Chest Portable 1 View  07/23/2013   CLINICAL DATA:  Shortness of breath.  EXAM: PORTABLE CHEST - 1 VIEW  COMPARISON:  None.  FINDINGS: 1145 hr. The heart size and mediastinal contours are normal. There is aortic atherosclerosis. The lungs demonstrate diffusely increased interstitial markings. There is a questionable small perihilar nodule on the left, although this could be secondary to overlap of vascular and osseous structures. No confluent  airspace opacity or significant pleural effusion is seen.  IMPRESSION: Interstitial prominence without definite edema or focal airspace disease. A small nodule in the left perihilar region cannot be excluded by this examination. Follow up PA and lateral views of the chest are recommended. If this is persistent, chest CT would be indicated for further evaluation.   Electronically Signed   By: Roxy Horseman M.D.   On: 07/23/2013 12:16     ASSESSMENT / PLAN:  PULMONARY Acute respiratory failure  COPD  Active smoker  P:   Attempt to come off BiPAP now, go to prn  O2 to keep sats 88-92%  BD  Systemic steroids  pulm hygiene once off bipap  Smoking cessation Will need outpt pulmonary f/u   CARDIOVASCULAR Hx HTN  CAD  P:  Monitor  Hold home ACE  RENAL Hyponatremia - mild  P:   F/u chem  Gentle fluids   GASTROINTESTINAL No active issue  P:   Diet started  HEMATOLOGIC Polycythemia  P:  F/u cbc  lovenox for dvt proph   INFECTIOUS AECOPD  P:   Empiric abx as above  Sputum culture   ENDOCRINE No active issue    P:   Monitor glucose on chem   NEUROLOGIC AMS - mild - in setting hypercarbia, improved P:   Supportive care as above    Levy Pupa, MD, PhD 07/24/2013, 2:31 PM [facility] [phone] or if no answer [phone]

## 2013-07-25 ENCOUNTER — Inpatient Hospital Stay (HOSPITAL_COMMUNITY): Payer: Medicare Other

## 2013-07-25 LAB — BLOOD GAS, ARTERIAL
Acid-Base Excess: 6.6 mmol/L — ABNORMAL HIGH (ref 0.0–2.0)
Bicarbonate: 31.6 mEq/L — ABNORMAL HIGH (ref 20.0–24.0)
Delivery systems: POSITIVE
Drawn by: 246101
Expiratory PAP: 5
Inspiratory PAP: 10
TCO2: 33.2 mmol/L (ref 0–100)
pCO2 arterial: 53.9 mmHg — ABNORMAL HIGH (ref 35.0–45.0)
pH, Arterial: 7.386 (ref 7.350–7.450)
pO2, Arterial: 59.6 mmHg — ABNORMAL LOW (ref 80.0–100.0)

## 2013-07-25 LAB — CBC
HCT: 40.9 % (ref 36.0–46.0)
Hemoglobin: 14.3 g/dL (ref 12.0–15.0)
MCH: 32.8 pg (ref 26.0–34.0)
MCHC: 35 g/dL (ref 30.0–36.0)
RBC: 4.36 MIL/uL (ref 3.87–5.11)
WBC: 11.6 10*3/uL — ABNORMAL HIGH (ref 4.0–10.5)

## 2013-07-25 LAB — BASIC METABOLIC PANEL
BUN: 23 mg/dL (ref 6–23)
CO2: 30 mEq/L (ref 19–32)
Calcium: 9.2 mg/dL (ref 8.4–10.5)
GFR calc non Af Amer: >90 mL/min (ref >90)
Glucose, Bld: 156 mg/dL — ABNORMAL HIGH (ref 70–99)
Potassium: 4.8 mEq/L (ref 3.5–5.1)
Sodium: 133 mEq/L — ABNORMAL LOW (ref 135–145)

## 2013-07-25 MED ORDER — ALPRAZOLAM 0.25 MG PO TABS
0.2500 mg | ORAL_TABLET | Freq: Three times a day (TID) | ORAL | Status: DC | PRN
  Administered 2013-07-25 – 2013-07-26 (×2): 0.25 mg via ORAL
  Filled 2013-07-25 (×2): qty 1

## 2013-07-25 MED ORDER — LORAZEPAM 0.5 MG PO TABS: 0.5000 mg | ORAL_TABLET | Freq: Once | ORAL | Status: DC

## 2013-07-25 MED ORDER — ASPIRIN EC 81 MG PO TBEC
81.0000 mg | DELAYED_RELEASE_TABLET | Freq: Every day | ORAL | Status: DC
  Administered 2013-07-25 – 2013-07-29 (×5): 81 mg via ORAL
  Filled 2013-07-25 (×5): qty 1

## 2013-07-25 MED ORDER — HEPARIN SODIUM (PORCINE) 5000 UNIT/ML IJ SOLN
5000.0000 [IU] | Freq: Three times a day (TID) | INTRAMUSCULAR | Status: DC
  Administered 2013-07-25 – 2013-07-29 (×12): 5000 [IU] via SUBCUTANEOUS
  Filled 2013-07-25 (×16): qty 1

## 2013-07-25 MED ORDER — PANTOPRAZOLE SODIUM 40 MG PO TBEC
40.0000 mg | DELAYED_RELEASE_TABLET | Freq: Two times a day (BID) | ORAL | Status: DC
  Administered 2013-07-25 – 2013-07-29 (×8): 40 mg via ORAL
  Filled 2013-07-25 (×8): qty 1

## 2013-07-25 MED ORDER — METHYLPREDNISOLONE SODIUM SUCC 40 MG IJ SOLR
40.0000 mg | Freq: Four times a day (QID) | INTRAMUSCULAR | Status: DC
  Administered 2013-07-25 – 2013-07-26 (×2): 40 mg via INTRAVENOUS
  Filled 2013-07-25 (×7): qty 1

## 2013-07-25 NOTE — Progress Notes (Signed)
PULMONARY  / CRITICAL CARE MEDICINE  Name: Morgan [name] MRN: [id number] DOB: Jul 24, 1946    ADMISSION DATE:  07/23/2013 CONSULTATION DATE:  10/23  REFERRING MD :  Jerral Ralph PRIMARY SERVICE: [facility]   CHIEF COMPLAINT:  COPD, Resp failure   BRIEF PATIENT DESCRIPTION: 40  yowf  active smoker with hx COPD admitted 10/23 by [facility] for AECOPD.  Respiratory status cont to decline with acute hypercarbic respiratory failure requiring Bipap and PCCM consulted.    SIGNIFICANT EVENTS / STUDIES:  10/23>> admitted, bipap      CULTURES: MRSA screen > neg   ANTIBIOTICS: Rocephin 10/23> 10/23 Azithro (aecopd)  10/23> >>    SUBJECTIVE:   improved but still bipap dep for increased wob at rest  VITAL SIGNS: Temp:  [97.4 F (36.3 C)-97.6 F (36.4 C)] 97.6 F (36.4 C) (10/25 0700) Pulse Rate:  [76-108] 95 (10/25 0916) Resp:  [15-24] 22 (10/25 0916) BP: (116-128)/(65-79) 128/76 mmHg (10/25 0820) SpO2:  [94 %-98 %] 94 % (10/25 0916) FiO2 (%):  [40 %] 40 % (10/25 0916) HEMODYNAMICS:   VENTILATOR SETTINGS: Vent Mode:  [-]  FiO2 (%):  [40 %] 40 % INTAKE / OUTPUT: Intake/Output     10/24 0701 - 10/25 0700 10/25 0701 - 10/26 0700   P.O. 600    I.V. (mL/kg) 110.5 (1.6)    IV Piggyback 250    Total Intake(mL/kg) 960.5 (13.7)    Urine (mL/kg/hr) 850 (0.5) 225 (0.7)   Total Output 850 225   Net +110.5 -225        Stool Occurrence 1 x     PHYSICAL EXAMINATION: General:  Chronically ill appearing female, bipap dep Neuro:  Awake, alert, answer questions, MAE  HEENT:  Mm dry, bipap, no JVD Cardiovascular:  s1s2 rrr, mild tachy Lungs:  resps even, distant, no wheezes Abdomen:  Soft, +bs Musculoskeletal:  Warm and dry, no edema  LABS:  CBC Recent Labs     07/23/13  1716  07/24/13  0445  07/25/13  0343  WBC  5.6  3.7*  11.6*  HGB  15.5*  14.7  14.3  HCT  44.5  42.2  40.9  PLT  166  179  205   Coag's Recent Labs     07/24/13  0445  INR  1.06   BMET Recent Labs     07/23/13  1146  07/23/13  1716  07/24/13  0445  07/25/13  0343  NA  133*   --   135  133*  K  4.0   --   4.3  4.8  CL  90*   --   94*  95*  CO2   --    --   28  30  BUN  16   --   22  23  CREATININE  0.90  0.65  0.60  0.58  GLUCOSE  117*   --   150*  156*   Electrolytes Recent Labs     07/24/13  0445  07/25/13  0343  CALCIUM  8.8  9.2   Sepsis Markers No results found for this basename: LACTICACIDVEN, PROCALCITON, O2SATVEN,  in the last 72 hours ABG Recent Labs     07/23/13  1712  07/24/13  1220  07/25/13  0907  PHART  7.252*  7.337*  7.386  PCO2ART  61.7*  57.4*  53.9*  PO2ART  149.0*  96.6  59.6*   Liver Enzymes No results found for this basename: AST, ALT, ALKPHOS, BILITOT, ALBUMIN,  in the last 72 hours Cardiac Enzymes Recent Labs     07/23/13  1716  07/23/13  2010  07/24/13  0445  TROPONINI  0.54*  1.07*  0.95*   Glucose No results found for this basename: GLUCAP,  in the last 72 hours  Imaging Dg Chest Portable 1 View  07/23/2013   CLINICAL DATA:  Shortness of breath.  EXAM: PORTABLE CHEST - 1 VIEW  COMPARISON:  None.  FINDINGS: 1145 hr. The heart size and mediastinal contours are normal. There is aortic atherosclerosis. The lungs demonstrate diffusely increased interstitial markings. There is a questionable small perihilar nodule on the left, although this could be secondary to overlap of vascular and osseous structures. No confluent airspace opacity or significant pleural effusion is seen.  IMPRESSION: Interstitial prominence without definite edema or focal airspace disease. A small nodule in the left perihilar region cannot be excluded by this examination. Follow up PA and lateral views of the chest are recommended. If this is persistent, chest CT would be indicated for further evaluation.   Electronically Signed   By: Roxy Horseman M.D.   On: 07/23/2013 12:16     ASSESSMENT / PLAN:  PULMONARY Acute respiratory failure  COPD  Active smoker  P:   Attempt to come  off BiPAP as tol, should be able to try up in chair 10/25 O2 to keep sats 88-92%  BD  Systemic steroids   Smoking cessation Will need outpt pulmonary f/u   CARDIOVASCULAR Hx HTN  CAD  P:  Monitor  Holding home ACE> not a good choice for a pt with unstable airway issues so rec permanently d/c  RENAL Hyponatremia - mild  P:   F/u chem  Gentle fluids   GASTROINTESTINAL No active issue  P:   Diet started  HEMATOLOGIC Polycythemia  P:  F/u cbc  lovenox for dvt proph   INFECTIOUS AECOPD  P:   Empiric abx as above     ENDOCRINE No active issue    P:   Monitor glucose on chem   NEUROLOGIC AMS - mild - in setting hypercarbia, improved P:   Supportive care as above     Sandrea Hughs, MD Pulmonary and Critical Care Medicine [facility] Cell [phone] After 5:30 PM or weekends, call [phone]

## 2013-07-25 NOTE — Progress Notes (Signed)
Pt remains on [place], pt states her breathing feels much better, pt denies SOB, pt requests to stay off bipap for now. RN aware, sats 98%, RR 18, no distress noted.

## 2013-07-25 NOTE — Progress Notes (Signed)
[facility] Progress Note [place] TEAM 1 - Stepdown/ICU TEAM   Morgan [name] WUJ:[id number] DOB: 11-Jul-1946 DOA: 07/23/2013 PCP: Rene Paci, MD  Brief narrative: Morgan [name] is a 67 y.o. female with a past medical history significant for COPD, continued tobacco abuse, cardiovascular disease, hypertension, hyperlipidemia, psoriasis. She presents with acute respiratory distress and is currently on a venturi mask. She is short of breath when speaking. She reports that her granddaughter was visiting her recently and had an upper respiratory illness. The patient has been having progressively worsening shortness of breath over the past 3 days. She has a nonproductive cough. She reports that she quit smoking when this illness started. She has had previous COPD exacerbations, but none that required her to come to the emergency department. She is compliant in taking albuterol and Advair. She is new to town and has not yet seen her new primary care physician, Dr. [name].    Assessment/Plan: Principal Problem:   Acute and chronic respiratory failure with hypoxia and hypercarbia  Acute exacerbation of COPD (chronic obstructive pulmonary disease) - also likely has resp infection  - ABG improved- prevent further CO2 retention by keeping pulse ox at 90 - decided to go back on BiPAP last night due to feeling short of breath but ABG and CXR are unremarkable- possibly anxious- will order PRN Xanax - taper steroids - cont Nebs, Mucinex and cont Zithro - needs outpt PFTs  Active Problems:  Elevated Troponins - per pt no h/o CAD - likely due to resp distress-trending down-  stop Heparin - ECHO reveals mod TR mod elevated pulm pressure (likely cor pulmonale)  - apparently cardio consulted but no one has come to evaluted- at this point there is no need    Chronic back pain - not on pain meds per med-rec    Hypertension - BP low - lisinopril on hold  Nicotine abuse-  - discussed  smoking cessation  Dehydration. hyponatremia - encourage oral hydration  Code Status: full code Family Communication: none Disposition Plan: follow in SDU today  Consultants: pulm  Procedures: none  Antibiotics: Zithro 10/23  DVT prophylaxis: Lovenox- d/c heparin infusion  HPI/Subjective: Feeling like she is short of breath- has been wearing BIPAP through the night. Called RN back about 2 hrs later- now per RN "feels great"    Objective: Blood pressure 116/74, pulse 99, temperature 97.5 F (36.4 C), temperature source Oral, resp. rate 24, height 5\' 6"  (1.676 m), weight 70 kg (154 lb 5.2 oz), SpO2 94.00%.  Intake/Output Summary (Last 24 hours) at 07/25/13 1250 Last data filed at 07/25/13 1138  Gross per 24 hour  Intake   1038 ml  Output   1400 ml  Net   -362 ml     Exam: General: No acute respiratory distress Lungs:  no wheezing,  coarse breath sounds Cardiovascular: Regular rate and rhythm without murmur gallop or rub normal S1 and S2 Abdomen: Nontender, nondistended, soft, bowel sounds positive, no rebound, no ascites, no appreciable mass Extremities: No significant cyanosis, clubbing, or edema bilateral lower extremities  Data Reviewed: Basic Metabolic Panel:  Recent Labs Lab 07/23/13 1146 07/23/13 1716 07/24/13 0445 07/25/13 0343  NA 133*  --  135 133*  K 4.0  --  4.3 4.8  CL 90*  --  94* 95*  CO2  --   --  28 30  GLUCOSE 117*  --  150* 156*  BUN 16  --  22 23  CREATININE 0.90 0.65 0.60 0.58  CALCIUM  --   --  8.8 9.2   Liver Function Tests: No results found for this basename: AST, ALT, ALKPHOS, BILITOT, PROT, ALBUMIN,  in the last 168 hours No results found for this basename: LIPASE, AMYLASE,  in the last 168 hours No results found for this basename: AMMONIA,  in the last 168 hours CBC:  Recent Labs Lab 07/23/13 1139 07/23/13 1146 07/23/13 1716 07/24/13 0445 07/25/13 0343  WBC 5.6  --  5.6 3.7* 11.6*  NEUTROABS 3.9  --   --   --   --    HGB 16.3* 18.0* 15.5* 14.7 14.3  HCT 44.7 53.0* 44.5 42.2 40.9  MCV 91.8  --  93.1 92.7 93.8  PLT 168  --  166 179 205   Cardiac Enzymes:  Recent Labs Lab 07/23/13 1716 07/23/13 2010 07/24/13 0445  TROPONINI 0.54* 1.07* 0.95*   BNP (last 3 results) No results found for this basename: PROBNP,  in the last 8760 hours CBG: No results found for this basename: GLUCAP,  in the last 168 hours  Recent Results (from the past 240 hour(s))  MRSA PCR SCREENING     Status: None   Collection Time    07/23/13  3:24 PM      Result Value Range Status   MRSA by PCR NEGATIVE  NEGATIVE Final   Comment:            The GeneXpert MRSA Assay (FDA     approved for NASAL specimens     only), is one component of a     comprehensive MRSA colonization     surveillance program. It is not     intended to diagnose MRSA     infection nor to guide or     monitor treatment for     MRSA infections.     Studies:  Recent x-ray studies have been reviewed in detail by the Attending Physician  Scheduled Meds:  Scheduled Meds: . ipratropium  0.5 mg Nebulization Q6H   And  . albuterol  2.5 mg Nebulization Q6H  . aspirin EC  81 mg Oral Daily  . azithromycin  500 mg Intravenous Q24H  . guaiFENesin  1,200 mg Oral BID  . heparin subcutaneous  5,000 Units Subcutaneous Q8H  . methylPREDNISolone (SOLU-MEDROL) injection  80 mg Intravenous Q6H  . pantoprazole  40 mg Oral BID AC  . sodium chloride  3 mL Intravenous Q12H   Continuous Infusions:    Time spent on care of this patient: 35 min   Garvis Downum, MD  [facility] Office  [phone] Pager - Text Page per Loretha Stapler as per below:  On-Call/Text Page:      [email or web address]      password [email or web address]  If 7PM-7AM, please contact night-coverage [email or web address] Password [email or web address] 07/25/2013, 12:50 PM   LOS: 2 days

## 2013-07-26 ENCOUNTER — Inpatient Hospital Stay (HOSPITAL_COMMUNITY): Payer: Medicare Other

## 2013-07-26 DIAGNOSIS — F411 Generalized anxiety disorder: Secondary | ICD-10-CM | POA: Diagnosis present

## 2013-07-26 DIAGNOSIS — I2781 Cor pulmonale (chronic): Secondary | ICD-10-CM | POA: Diagnosis present

## 2013-07-26 LAB — CBC
Hemoglobin: 13.6 g/dL (ref 12.0–15.0)
Platelets: 218 10*3/uL (ref 150–400)
RBC: 4.26 MIL/uL (ref 3.87–5.11)
WBC: 9.9 10*3/uL (ref 4.0–10.5)

## 2013-07-26 MED ORDER — CITALOPRAM HYDROBROMIDE 20 MG PO TABS
20.0000 mg | ORAL_TABLET | Freq: Every day | ORAL | Status: DC
  Administered 2013-07-26 – 2013-07-28 (×3): 20 mg via ORAL
  Filled 2013-07-26 (×4): qty 1

## 2013-07-26 MED ORDER — PREDNISONE 20 MG PO TABS
40.0000 mg | ORAL_TABLET | Freq: Two times a day (BID) | ORAL | Status: DC
  Administered 2013-07-26 – 2013-07-27 (×2): 40 mg via ORAL
  Filled 2013-07-26 (×4): qty 2

## 2013-07-26 MED ORDER — AZITHROMYCIN 500 MG PO TABS
500.0000 mg | ORAL_TABLET | Freq: Every day | ORAL | Status: DC
  Administered 2013-07-26 – 2013-07-29 (×4): 500 mg via ORAL
  Filled 2013-07-26 (×4): qty 1

## 2013-07-26 MED ORDER — BUSPIRONE HCL 5 MG PO TABS
5.0000 mg | ORAL_TABLET | Freq: Three times a day (TID) | ORAL | Status: DC | PRN
  Administered 2013-07-26 – 2013-07-29 (×7): 5 mg via ORAL
  Filled 2013-07-26 (×9): qty 1

## 2013-07-26 MED ORDER — IOHEXOL 300 MG/ML  SOLN
75.0000 mL | Freq: Once | INTRAMUSCULAR | Status: AC | PRN
  Administered 2013-07-26: 75 mL via INTRAVENOUS

## 2013-07-26 NOTE — Progress Notes (Signed)
PULMONARY  / CRITICAL CARE MEDICINE  Name: Morgan [name] MRN: [id number] DOB: [birth year]    ADMISSION DATE:  07/23/2013 CONSULTATION DATE:  10/23  REFERRING MD :  Jerral Ralph PRIMARY SERVICE: [facility]   CHIEF COMPLAINT:  COPD, Resp failure   BRIEF PATIENT DESCRIPTION: 52  yowf  active smoker with hx COPD admitted 10/23 by [facility] for AECOPD.  Respiratory status cont to decline with acute hypercarbic respiratory failure requiring Bipap and PCCM consulted.      SIGNIFICANT EVENTS / STUDIES:  10/23>> admitted, bipap  10/24>   last night with bipap     CULTURES: MRSA screen > neg   ANTIBIOTICS: Rocephin 10/23> 10/23 Azithro (aecopd)  10/23> >>    SUBJECTIVE:   improved but still bipap dep for increased wob at rest   VITAL SIGNS: Temp:  [97 F (36.1 C)-97.6 F (36.4 C)] 97.6 F (36.4 C) (10/26 0700) Pulse Rate:  [89-109] 89 (10/26 0419) Resp:  [17-24] 17 (10/26 0419) BP: (109-141)/(65-85) 109/65 mmHg (10/26 0419) SpO2:  [90 %-98 %] 90 % (10/26 0924) FiO2 (%):  [35 %] 35 % (10/25 1939)  HEMODYNAMICS:   VENTILATOR SETTINGS: Vent Mode:  [-]  FiO2 (%):  [35 %] 35 % INTAKE / OUTPUT: Intake/Output     10/25 0701 - 10/26 0700 10/26 0701 - 10/27 0700   P.O. 600    I.V. (mL/kg)     IV Piggyback 250    Total Intake(mL/kg) 850 (12.1)    Urine (mL/kg/hr) 975 (0.6)    Total Output 975     Net -125          Urine Occurrence 1 x     PHYSICAL EXAMINATION: General:  Chronically ill appearing female no increased wob on nasal 02 overnight Neuro:  Awake, alert, answer questions, MAE  HEENT:  Mm dry, bipap, no JVD Cardiovascular:  s1s2 rrr, mild tachy Lungs:   distant, no wheezes, marked increased T exp/ barrel chest  Abdomen:  Soft, +bs Musculoskeletal:  Warm and dry, no edema  LABS:  CBC Recent Labs     07/24/13  0445  07/25/13  0343  07/26/13  0400  WBC  3.7*  11.6*  9.9  HGB  14.7  14.3  13.6  HCT  42.2  40.9  39.9  PLT  179  205  218   Coag's Recent Labs   07/24/13  0445  INR  1.06   BMET Recent Labs     07/23/13  1146  07/23/13  1716  07/24/13  0445  07/25/13  0343  NA  133*   --   135  133*  K  4.0   --   4.3  4.8  CL  90*   --   94*  95*  CO2   --    --   28  30  BUN  16   --   22  23  CREATININE  0.90  0.65  0.60  0.58  GLUCOSE  117*   --   150*  156*   Electrolytes Recent Labs     07/24/13  0445  07/25/13  0343  CALCIUM  8.8  9.2   Sepsis Markers No results found for this basename: LACTICACIDVEN, PROCALCITON, O2SATVEN,  in the last 72 hours ABG Recent Labs     07/23/13  1712  07/24/13  1220  07/25/13  0907  PHART  7.252*  7.337*  7.386  PCO2ART  61.7*  57.4*  53.9*  PO2ART  149.0*  96.6  59.6*   Liver Enzymes No results found for this basename: AST, ALT, ALKPHOS, BILITOT, ALBUMIN,  in the last 72 hours Cardiac Enzymes Recent Labs     07/23/13  1716  07/23/13  2010  07/24/13  0445  TROPONINI  0.54*  1.07*  0.95*   Glucose No results found for this basename: GLUCAP,  in the last 72 hours  Imaging Dg Chest Port 1 View  07/25/2013   CLINICAL DATA:  Dyspnea, smoking history  EXAM: PORTABLE CHEST - 1 VIEW  COMPARISON:  07/23/2013  FINDINGS: Heart size and vascular pattern are normal. Mild diffuse interstitial prominence is stable. No evidence of consolidation or effusion. Possible tiny left perihilar nodule again identified.  IMPRESSION: No change from prior study with mild diffuse interstitial prominence. Stable tiny nodular opacity left perihilar area. As recommended on prior study report, CT thorax could be considered.   Electronically Signed   By: Esperanza Heir M.D.   On: 07/25/2013 12:13     ASSESSMENT / PLAN:  PULMONARY Acute respiratory failure  COPD  Active smoker   P:   Ok off bipap x 24h, ok for floor O2 to keep sats 88-92%  BD  Systemic steroids   Smoking cessation Will need outpt pulmonary f/u   CARDIOVASCULAR Hx HTN  CAD  P:  Monitor  Holding home ACE> not a good choice for a  pt with unstable airway issues so rec permanently d/c  RENAL Hyponatremia - mild  P:   F/u chem  Gentle fluids   GASTROINTESTINAL No active issue  P:   Diet started  HEMATOLOGIC Polycythemia, resolved P:  F/u cbc  lovenox for dvt proph   INFECTIOUS AECOPD  P:   Empiric abx as per dashboard    ENDOCRINE No active issue    P:   Monitor glucose on chem   NEUROLOGIC AMS - mild - in setting hypercarbia, improved P:   Supportive care as above      Sandrea Hughs, MD Pulmonary and Critical Care Medicine [facility] Cell [phone] After 5:30 PM or weekends, call [phone]

## 2013-07-26 NOTE — Progress Notes (Signed)
[facility] Progress Note [place] TEAM 1 - Stepdown/ICU TEAM   Tekla Malachowski ZOX:[id number] DOB: October 18, 1945 DOA: 07/23/2013 PCP: Rene Paci, MD  Brief narrative: Morgan [name] is a 67 y.o. female with a past medical history significant for COPD, continued tobacco abuse, cardiovascular disease, hypertension, hyperlipidemia, psoriasis. She presents with acute respiratory distress and is currently on a venturi mask. She is short of breath when speaking. She reports that her granddaughter was visiting her recently and had an upper respiratory illness. The patient has been having progressively worsening shortness of breath over the past 3 days. She has a nonproductive cough. She reports that she quit smoking when this illness started. She has had previous COPD exacerbations, but none that required her to come to the emergency department. She is compliant in taking albuterol and Advair. She is new to town and has not yet seen her new primary care physician, Dr. [name].    Assessment/Plan: Principal Problem:   Acute and chronic respiratory failure with hypoxia and hypercarbia  Acute exacerbation of COPD (chronic obstructive pulmonary disease) - also likely has resp infection  - ABG improved- prevent further CO2 retention by keeping pulse ox at 90 - severe anxiety is contributing- see below  - taper steroids - cont Nebs, Mucinex and cont Zithro - needs outpt PFTs  Active Problems:  Elevated Troponins/ Cor pulmonale - per pt no h/o CAD - likely due to resp distress-trending down-  stop Heparin - ECHO reveals mod TR mod elevated pulm pressure (likely cor pulmonale)  - apparently cardio consulted but no one has come to evaluted- at this point there is no need  Anxiety- Pt appeared anxious the previous day and feld unsafe without the BiPAP although there was not worsening of pulm issues and therefore I added PRN Xanax - she tells me today that it has helped her tremendously she  has numerous life issues that have been bringing her stress- at this time we have discussed starting an SSRI for control of anxiety- will use short acting anxiolytics until SSRI is adequately treating it - Have chosen to switch to Buspar and Celexa as she is currently int he [place] for meds.     Chronic back pain - not on pain meds per med-rec    Hypertension - BP low - lisinopril on hold  Nicotine abuse-  - discussed smoking cessation  Dehydration. hyponatremia - encourage oral hydration  Code Status: full code Family Communication: updated son today Disposition Plan: follow in SDU today- pt too anxious to want to move to another floor  Consultants: pulm  Procedures: none  Antibiotics: Zithro 10/23  DVT prophylaxis: Lovenox- d/c heparin infusion  HPI/Subjective: Feels well today- states xanax helped her sensation of "fear" a great deal. Has dyspnea on exertion still.    Objective: Blood pressure 119/82, pulse 102, temperature 97.7 F (36.5 C), temperature source Oral, resp. rate 25, height 5\' 6"  (1.676 m), weight 70 kg (154 lb 5.2 oz), SpO2 91.00%.  Intake/Output Summary (Last 24 hours) at 07/26/13 1454 Last data filed at 07/26/13 1100  Gross per 24 hour  Intake    240 ml  Output    825 ml  Net   -585 ml     Exam: General: No acute respiratory distress Lungs:  no wheezing,  coarse breath sounds Cardiovascular: Regular rate and rhythm without murmur gallop or rub normal S1 and S2 Abdomen: Nontender, nondistended, soft, bowel sounds positive, no rebound, no ascites, no appreciable mass Extremities: No significant  cyanosis, clubbing, or edema bilateral lower extremities  Data Reviewed: Basic Metabolic Panel:  Recent Labs Lab 07/23/13 1146 07/23/13 1716 07/24/13 0445 07/25/13 0343  NA 133*  --  135 133*  K 4.0  --  4.3 4.8  CL 90*  --  94* 95*  CO2  --   --  28 30  GLUCOSE 117*  --  150* 156*  BUN 16  --  22 23  CREATININE 0.90 0.65 0.60 0.58   CALCIUM  --   --  8.8 9.2   Liver Function Tests: No results found for this basename: AST, ALT, ALKPHOS, BILITOT, PROT, ALBUMIN,  in the last 168 hours No results found for this basename: LIPASE, AMYLASE,  in the last 168 hours No results found for this basename: AMMONIA,  in the last 168 hours CBC:  Recent Labs Lab 07/23/13 1139 07/23/13 1146 07/23/13 1716 07/24/13 0445 07/25/13 0343 07/26/13 0400  WBC 5.6  --  5.6 3.7* 11.6* 9.9  NEUTROABS 3.9  --   --   --   --   --   HGB 16.3* 18.0* 15.5* 14.7 14.3 13.6  HCT 44.7 53.0* 44.5 42.2 40.9 39.9  MCV 91.8  --  93.1 92.7 93.8 93.7  PLT 168  --  166 179 205 218   Cardiac Enzymes:  Recent Labs Lab 07/23/13 1716 07/23/13 2010 07/24/13 0445  TROPONINI 0.54* 1.07* 0.95*   BNP (last 3 results) No results found for this basename: PROBNP,  in the last 8760 hours CBG: No results found for this basename: GLUCAP,  in the last 168 hours  Recent Results (from the past 240 hour(s))  MRSA PCR SCREENING     Status: None   Collection Time    07/23/13  3:24 PM      Result Value Range Status   MRSA by PCR NEGATIVE  NEGATIVE Final   Comment:            The GeneXpert MRSA Assay (FDA     approved for NASAL specimens     only), is one component of a     comprehensive MRSA colonization     surveillance program. It is not     intended to diagnose MRSA     infection nor to guide or     monitor treatment for     MRSA infections.     Studies:  Recent x-ray studies have been reviewed in detail by the Attending Physician  Scheduled Meds:  Scheduled Meds: . ipratropium  0.5 mg Nebulization Q6H   And  . albuterol  2.5 mg Nebulization Q6H  . aspirin EC  81 mg Oral Daily  . azithromycin  500 mg Oral Daily  . citalopram  20 mg Oral QHS  . guaiFENesin  1,200 mg Oral BID  . heparin subcutaneous  5,000 Units Subcutaneous Q8H  . LORazepam  0.5 mg Oral Once  . pantoprazole  40 mg Oral BID AC  . predniSONE  40 mg Oral BID WC  . sodium  chloride  3 mL Intravenous Q12H   Continuous Infusions:    Time spent on care of this patient: 35 min   Kelty Szafran, MD  [facility] Office  [phone] Pager - Text Page per Loretha Stapler as per below:  On-Call/Text Page:      [email or web address]      password [email or web address]  If 7PM-7AM, please contact night-coverage [email or web address] Password [email or web address] 07/26/2013, 2:54 PM   LOS: 3 days

## 2013-07-27 LAB — CBC
HCT: 39.6 % (ref 36.0–46.0)
Hemoglobin: 13.5 g/dL (ref 12.0–15.0)
MCHC: 34.1 g/dL (ref 30.0–36.0)
MCV: 94.3 fL (ref 78.0–100.0)
RBC: 4.2 MIL/uL (ref 3.87–5.11)
WBC: 8.3 10*3/uL (ref 4.0–10.5)

## 2013-07-27 MED ORDER — PREDNISONE 20 MG PO TABS
40.0000 mg | ORAL_TABLET | Freq: Every day | ORAL | Status: DC
  Filled 2013-07-27: qty 2

## 2013-07-27 MED ORDER — ALBUTEROL SULFATE (5 MG/ML) 0.5% IN NEBU: 2.5000 mg | INHALATION_SOLUTION | RESPIRATORY_TRACT | Status: DC | PRN

## 2013-07-27 MED ORDER — PREDNISONE 20 MG PO TABS
20.0000 mg | ORAL_TABLET | Freq: Every day | ORAL | Status: DC
  Administered 2013-07-28 – 2013-07-29 (×2): 20 mg via ORAL
  Filled 2013-07-27 (×3): qty 1

## 2013-07-27 NOTE — Progress Notes (Signed)
[facility] Progress Note [place] TEAM 1 - Stepdown/ICU TEAM   Morgan [name] WUJ:[id number] DOB: Jun 02, 1946 DOA: 07/23/2013 PCP: Morgan Paci, MD  Brief narrative: 67 y.o. female with a past medical history significant for COPD, continued tobacco abuse, cardiovascular disease, hypertension, hyperlipidemia, psoriasis. She presented with acute respiratory distress requiring a venturi mask. She was short of breath when speaking. She reported that her granddaughter was visiting her and had an upper respiratory illness. The patient had been having progressively worsening shortness of breath over 3 days. She had a nonproductive cough. She reported that she quit smoking when this illness started. She has had previous COPD exacerbations, but none that required her to come to the emergency department. She is new to town and had not yet seen her new primary care physician, Morgan [name].   Assessment/Plan:  Acute on chronic respiratory failure with hypoxia and hypercarbia due to Acute exacerbation of COPD - wheezing improved today  - severe anxiety is contributing - see below  - tapering steroids - needs outpt PFTs - will need home O2 as she desaturates severely with ambulation, and sats on  RA even at rest are 84%  - needs Pulmonary f/u after d/c for long term management  Elevated Troponin / Cor pulmonale - per pt no h/o CAD - likely due to resp distress -trending down - ECHO reveals mod TR mod elevated pulm pressure (likely cor pulmonale)  - apparently Cards consulted but no one has come to evalute - at this point there is no need - recheck troponin in AM to assure has normalized   Anxiety - currently controlled with plan for long term tx w/ SSRI  Chronic back pain - not on pain meds per med-rec - well controlled at this time   Hypertension - BP stabilizing - cont current tx regimen w/o change today   Nicotine abuse - discussed absolute need for smoking cessation  Code  Status: FULL Family Communication: No family present at time of exam today Disposition Plan: Arrange home oxygen - monitor overnight in current bed for probable discharge home in a.m.  Consultants: PCCM  Procedures: none  Antibiotics: Zithro 10/23 >>  DVT prophylaxis: [place] heparin   HPI/Subjective: The patient reports she feels much better today.  She admits to being quite short or breath with exertion.  She denies chest pain fevers chills nausea vomiting or abdominal pain.  Objective: Blood pressure 121/77, pulse 90, temperature 97.8 F (36.6 C), temperature source Oral, resp. rate 21, height 5\' 6"  (1.676 m), weight 70 kg (154 lb 5.2 oz), SpO2 95.00%.  Intake/Output Summary (Last 24 hours) at 07/27/13 1053 Last data filed at 07/27/13 0700  Gross per 24 hour  Intake    360 ml  Output   1350 ml  Net   -990 ml   Exam: General: No acute respiratory distress at rest Lungs:  Very distant breath sounds in all fields with no wheeze or focal crackles appreciable Cardiovascular: Distant heart sounds but regular without appreciable murmur Abdomen: Nontender, nondistended, soft, bowel sounds positive, no rebound, no ascites, no appreciable mass Extremities: No significant cyanosis, clubbing, or edema bilateral lower extremities  Data Reviewed: Basic Metabolic Panel:  Recent Labs Lab 07/23/13 1146 07/23/13 1716 07/24/13 0445 07/25/13 0343  NA 133*  --  135 133*  K 4.0  --  4.3 4.8  CL 90*  --  94* 95*  CO2  --   --  28 30  GLUCOSE 117*  --  150* 156*  BUN 16  --  22 23  CREATININE 0.90 0.65 0.60 0.58  CALCIUM  --   --  8.8 9.2   Liver Function Tests: No results found for this basename: AST, ALT, ALKPHOS, BILITOT, PROT, ALBUMIN,  in the last 168 hours  CBC:  Recent Labs Lab 07/23/13 1139  07/23/13 1716 07/24/13 0445 07/25/13 0343 07/26/13 0400 07/27/13 0450  WBC 5.6  --  5.6 3.7* 11.6* 9.9 8.3  NEUTROABS 3.9  --   --   --   --   --   --   HGB 16.3*  < > 15.5*  14.7 14.3 13.6 13.5  HCT 44.7  < > 44.5 42.2 40.9 39.9 39.6  MCV 91.8  --  93.1 92.7 93.8 93.7 94.3  PLT 168  --  166 179 205 218 225  < > = values in this interval not displayed. Cardiac Enzymes:  Recent Labs Lab 07/23/13 1716 07/23/13 2010 07/24/13 0445  TROPONINI 0.54* 1.07* 0.95*    Recent Results (from the past 240 hour(s))  MRSA PCR SCREENING     Status: None   Collection Time    07/23/13  3:24 PM      Result Value Range Status   MRSA by PCR NEGATIVE  NEGATIVE Final   Comment:            The GeneXpert MRSA Assay (FDA     approved for NASAL specimens     only), is one component of a     comprehensive MRSA colonization     surveillance program. It is not     intended to diagnose MRSA     infection nor to guide or     monitor treatment for     MRSA infections.     Studies:  Recent x-ray studies have been reviewed in detail by the Attending Physician  Scheduled Meds:  Scheduled Meds: . ipratropium  0.5 mg Nebulization Q6H   And  . albuterol  2.5 mg Nebulization Q6H  . aspirin EC  81 mg Oral Daily  . azithromycin  500 mg Oral Daily  . citalopram  20 mg Oral QHS  . heparin subcutaneous  5,000 Units Subcutaneous Q8H  . LORazepam  0.5 mg Oral Once  . pantoprazole  40 mg Oral BID AC  . [START ON 07/28/2013] predniSONE  40 mg Oral Q breakfast  . sodium chloride  3 mL Intravenous Q12H   Time spent on care of this patient: 35 min   MCCLUNG,JEFFREY T, MD  [facility] Office  [phone] Pager - Text Page per Loretha Stapler as per below:  On-Call/Text Page:      [email or web address]      password [email or web address]  If 7PM-7AM, please contact night-coverage [email or web address] Password [email or web address] 07/27/2013, 10:53 AM   LOS: 4 days

## 2013-07-27 NOTE — Progress Notes (Signed)
SATURATION QUALIFICATIONS: (This note is used to comply with regulatory documentation for home oxygen)  Patient Saturations on Room Air at Rest = 84%  Patient Saturations on Room Air while Ambulating = 79%  Patient Saturations on 2.5 Liters of oxygen while Ambulating = 91%  Please briefly explain why patient needs home oxygen:

## 2013-07-27 NOTE — Progress Notes (Signed)
I have scheduled F/U as outpt with Dr Sherene Sires on 11/03 @ 3:15  She may be discharged @ any time per primary team judgment.  She should be discharged on Advair and PRN albuterol (her prior meds)  She should complete a 5 day course of abx  I have changed prednisone to 40 mg daily and this should be taper to off over the next 5-7 days  I have counseled her re: smoking cessation   PCCM will sign off. Please call if we can be of further assistance  Billy Fischer, MD ; [facility] [phone].  After 5:30 PM or weekends, call [phone]

## 2013-07-28 ENCOUNTER — Inpatient Hospital Stay (HOSPITAL_COMMUNITY): Payer: Medicare Other

## 2013-07-28 DIAGNOSIS — I509 Heart failure, unspecified: Secondary | ICD-10-CM

## 2013-07-28 LAB — BASIC METABOLIC PANEL
BUN: 16 mg/dL (ref 6–23)
Chloride: 94 mEq/L — ABNORMAL LOW (ref 96–112)
Creatinine, Ser: 0.64 mg/dL (ref 0.50–1.10)
GFR calc Af Amer: >90 mL/min (ref >90)
GFR calc non Af Amer: 90 mL/min — ABNORMAL LOW (ref >90)
Glucose, Bld: 101 mg/dL — ABNORMAL HIGH (ref 70–99)
Potassium: 4.3 mEq/L (ref 3.5–5.1)
Sodium: 135 mEq/L (ref 135–145)

## 2013-07-28 LAB — TROPONIN I: Troponin I: <0.3 ng/mL (ref <0.30)

## 2013-07-28 MED ORDER — ALBUTEROL SULFATE (5 MG/ML) 0.5% IN NEBU
2.5000 mg | INHALATION_SOLUTION | Freq: Three times a day (TID) | RESPIRATORY_TRACT | Status: DC
  Administered 2013-07-29: 2.5 mg via RESPIRATORY_TRACT
  Filled 2013-07-28: qty 0.5

## 2013-07-28 MED ORDER — IPRATROPIUM BROMIDE 0.02 % IN SOLN
0.5000 mg | Freq: Three times a day (TID) | RESPIRATORY_TRACT | Status: DC
  Administered 2013-07-29: 0.5 mg via RESPIRATORY_TRACT
  Filled 2013-07-28: qty 2.5

## 2013-07-28 NOTE — Progress Notes (Signed)
[facility] Progress Note [place] TEAM 1 - Stepdown/ICU TEAM   Morgan [name] RUE:[id number] DOB: 27-Jul-1946 DOA: 07/23/2013 PCP: Rene Paci, MD  Brief narrative: 67 y.o. female with a past medical history significant for COPD, continued tobacco abuse, cardiovascular disease, hypertension, hyperlipidemia, psoriasis. She presented with acute respiratory distress requiring a venturi mask. She was short of breath when speaking. She reported that her granddaughter was visiting her and had an upper respiratory illness. The patient had been having progressively worsening shortness of breath over 3 days. She had a nonproductive cough. She reported that she quit smoking when this illness started. She has had previous COPD exacerbations, but none that required her to come to the emergency department. She is new to town and had not yet seen her new primary care physician, Dr. [name].   Assessment/Plan:  Acute on chronic respiratory failure with hypoxia and hypercarbia due to Acute exacerbation of COPD - wheezing improved today  - severe anxiety is contributing - see below  - tapering steroids - needs outpt PFTs - will need home O2 as she desaturates severely with ambulation, and sats on  RA even at rest are 84%  - needs Pulmonary f/u after d/c for long term management - requiring more O2 today then yesterday - CXR reveals atelectasis in left base where I hear crackles- pt stating she is having trouble still with breathing- cont Incentive spirometery and ambulation  Elevated Troponin / Cor pulmonale - per pt no h/o CAD - likely due to resp distress -trending down - ECHO reveals mod TR mod elevated pulm pressure (likely cor pulmonale)  - apparently Cards consulted but no one has come to evalute - at this point there is no need - troponin has normalized   Anxiety - currently controlled with plan for long term tx w/ SSRI  Chronic back pain - not on pain meds per med-rec - well  controlled at this time   Hypertension - BP stabilizing - cont current tx regimen w/o change today   Nicotine abuse - discussed absolute need for smoking cessation  Code Status: FULL Family Communication: No family present at time of exam today Disposition Plan: Arrange home oxygen - monitor overnight in current bed for probable discharge home in a.m.  Consultants: PCCM  Procedures: none  Antibiotics: Zithro 10/23 >>  DVT prophylaxis: [place] heparin   HPI/Subjective: The patient reports she still she is still short of breath. Per, RN O2 needs have gone up- she dropped to 88 last night when laying flat and has not been able to come back up  Objective: Blood pressure 98/62, pulse 100, temperature 97.6 F (36.4 C), temperature source Axillary, resp. rate 21, height 5\' 6"  (1.676 m), weight 70 kg (154 lb 5.2 oz), SpO2 92.00%.  Intake/Output Summary (Last 24 hours) at 07/28/13 1749 Last data filed at 07/28/13 1600  Gross per 24 hour  Intake    600 ml  Output    400 ml  Net    200 ml   Exam: General: No acute respiratory distress at rest Lungs:  Very distant breath sounds in all fields with no wheeze or focal crackles appreciable Cardiovascular: Distant heart sounds but regular without appreciable murmur Abdomen: Nontender, nondistended, soft, bowel sounds positive, no rebound, no ascites, no appreciable mass Extremities: No significant cyanosis, clubbing, or edema bilateral lower extremities  Data Reviewed: Basic Metabolic Panel:  Recent Labs Lab 07/23/13 1146 07/23/13 1716 07/24/13 0445 07/25/13 0343 07/28/13 0601  NA 133*  --  135 133* 135  K 4.0  --  4.3 4.8 4.3  CL 90*  --  94* 95* 94*  CO2  --   --  28 30 35*  GLUCOSE 117*  --  150* 156* 101*  BUN 16  --  22 23 16   CREATININE 0.90 0.65 0.60 0.58 0.64  CALCIUM  --   --  8.8 9.2 8.4   Liver Function Tests: No results found for this basename: AST, ALT, ALKPHOS, BILITOT, PROT, ALBUMIN,  in the last 168  hours  CBC:  Recent Labs Lab 07/23/13 1139  07/23/13 1716 07/24/13 0445 07/25/13 0343 07/26/13 0400 07/27/13 0450  WBC 5.6  --  5.6 3.7* 11.6* 9.9 8.3  NEUTROABS 3.9  --   --   --   --   --   --   HGB 16.3*  < > 15.5* 14.7 14.3 13.6 13.5  HCT 44.7  < > 44.5 42.2 40.9 39.9 39.6  MCV 91.8  --  93.1 92.7 93.8 93.7 94.3  PLT 168  --  166 179 205 218 225  < > = values in this interval not displayed. Cardiac Enzymes:  Recent Labs Lab 07/23/13 1716 07/23/13 2010 07/24/13 0445 07/28/13 0601  TROPONINI 0.54* 1.07* 0.95* <0.30    Recent Results (from the past 240 hour(s))  MRSA PCR SCREENING     Status: None   Collection Time    07/23/13  3:24 PM      Result Value Range Status   MRSA by PCR NEGATIVE  NEGATIVE Final   Comment:            The GeneXpert MRSA Assay (FDA     approved for NASAL specimens     only), is one component of a     comprehensive MRSA colonization     surveillance program. It is not     intended to diagnose MRSA     infection nor to guide or     monitor treatment for     MRSA infections.     Studies:  Recent x-ray studies have been reviewed in detail by the Attending Physician  Scheduled Meds:  Scheduled Meds: . ipratropium  0.5 mg Nebulization Q6H   And  . albuterol  2.5 mg Nebulization Q6H  . aspirin EC  81 mg Oral Daily  . azithromycin  500 mg Oral Daily  . citalopram  20 mg Oral QHS  . heparin subcutaneous  5,000 Units Subcutaneous Q8H  . LORazepam  0.5 mg Oral Once  . pantoprazole  40 mg Oral BID AC  . predniSONE  20 mg Oral Q breakfast  . sodium chloride  3 mL Intravenous Q12H   Time spent on care of this patient: 35 min   Calvert Cantor, MD  [facility] Office  [phone] Pager - Text Page per Loretha Stapler as per below:  On-Call/Text Page:      [email or web address]      password [email or web address]  If 7PM-7AM, please contact night-coverage [email or web address] Password [email or web address] 07/28/2013, 5:49 PM   LOS: 5 days

## 2013-07-28 NOTE — Progress Notes (Signed)
Ambulated pt in halls, took multiple breaks, the lowest sats dropped was 87% on 3l/min via nasal cannula. Ambulated 250 ft. With rolling walker.

## 2013-07-29 MED ORDER — CITALOPRAM HYDROBROMIDE 20 MG PO TABS: 20.0000 mg | ORAL_TABLET | Freq: Every day | ORAL | Status: DC

## 2013-07-29 MED ORDER — PNEUMOCOCCAL VAC POLYVALENT 25 MCG/0.5ML IJ INJ
0.5000 mL | INJECTION | INTRAMUSCULAR | Status: AC
  Administered 2013-07-29: 0.5 mL via INTRAMUSCULAR
  Filled 2013-07-29: qty 0.5

## 2013-07-29 MED ORDER — ASPIRIN 81 MG PO TBEC: 81.0000 mg | DELAYED_RELEASE_TABLET | Freq: Every day | ORAL | Status: DC

## 2013-07-29 MED ORDER — PREDNISONE 20 MG PO TABS: ORAL_TABLET | ORAL | Status: DC

## 2013-07-29 MED ORDER — BUSPIRONE HCL 5 MG PO TABS: 5.0000 mg | ORAL_TABLET | Freq: Three times a day (TID) | ORAL | Status: DC | PRN

## 2013-07-29 MED ORDER — INFLUENZA VAC SPLIT QUAD 0.5 ML IM SUSP
0.5000 mL | INTRAMUSCULAR | Status: AC
  Administered 2013-07-29: 0.5 mL via INTRAMUSCULAR
  Filled 2013-07-29: qty 0.5

## 2013-07-29 NOTE — Discharge Summary (Signed)
DISCHARGE SUMMARY  Morgan [name]  MR#: [id number]  DOB:December 02, 1945  Date of Admission: 07/23/2013 Date of Discharge: 07/29/2013  Attending Physician:Jeanet Lupe T  Patient's HQI:ONGEXBM Morgan Coyer, MD  Consults: PCCM - Dr. Billy Fischer   Disposition: D/C Home w/ son  Follow-up Appts:     Follow-up Information   Follow up with Sandrea Hughs, MD On 08/03/2013. (3:15PM )    Specialty:  Pulmonary Disease   Contact information:   [address] [place] [address] [phone]       Follow up with Rene Paci, MD. Schedule an appointment as soon as possible for a visit in 5 days.   Specialty:  Internal Medicine   Contact information:   [address] [place] [address] [phone]      Discharge Diagnoses: Acute on chronic respiratory failure with hypoxia and hypercarbia due to acute exacerbation of COPD  Elevated Troponin due to Cor pulmonale and acute resp distress Anxiety  Chronic back pain  Hypertension  Nicotine abuse - smoker  Initial presentation: 67 y.o. female with a past medical history significant for COPD, continued tobacco abuse, cardiovascular disease, hypertension, hyperlipidemia, psoriasis. She presented with acute respiratory distress requiring a venturi mask. She was short of breath when speaking. She reported that her granddaughter was visiting her and had an upper respiratory illness. The patient had been having progressively worsening shortness of breath over 3 days. She had a nonproductive cough. She reported that she quit smoking when this illness started. She has had previous COPD exacerbations, but none that required her to come to the emergency department. She is new to town and had not yet seen her new primary care physician, Dr. Felicity [name].   Hospital Course:  Acute on chronic respiratory failure with hypoxia and hypercarbia due to Acute exacerbation of COPD  - wheezing improved w/ abx, steroids, and  nebs - severe anxiety was contributing - tapering steroids slowly - needs outpt PFTs - to f/u w/ [facility]  - have arranged home O2 as she desaturates severely with ambulation, and sats on RA even at rest are 84%  - needs Pulmonary f/u after d/c for long term management   Elevated Troponin / Cor pulmonale  - per pt no h/o CAD  - likely due to resp distress with cor pulmonale - ECHO revealed mod TR, mod elevated pulm pressure (likely cor pulmonale)  - troponin normalized - pt remained asymptomatic   Anxiety  - controlled with plan for long term tx w/ SSRI (newly diagnosed)  Chronic back pain  - not on pain meds per med-rec - well controlled   Hypertension  - BP stable   Nicotine abuse  - discussed absolute need for smoking cessation     Medication List    STOP taking these medications       lisinopril 20 MG tablet  Commonly known as:  PRINIVIL,ZESTRIL      TAKE these medications       albuterol 108 (90 BASE) MCG/ACT inhaler  Commonly known as:  PROVENTIL HFA;VENTOLIN HFA  Inhale 2 puffs into the lungs every 4 (four) hours as needed for wheezing.     aspirin 81 MG EC tablet  Take 1 tablet (81 mg total) by mouth daily.     busPIRone 5 MG tablet  Commonly known as:  BUSPAR  Take 1 tablet (5 mg total) by mouth 3 (three) times daily as needed (anxiety).     citalopram 20 MG tablet  Commonly known as:  CELEXA  Take 1 tablet (20 mg total) by mouth at bedtime.     EPINEPHrine 0.3 mg/0.3 mL Soaj injection  Commonly known as:  EPI-PEN  Inject 0.3 mg into the muscle once.     EYE DROPS OP  Place 1 drop into both eyes daily as needed (dry eyes).     Fluticasone-Salmeterol 500-50 MCG/DOSE Aepb  Commonly known as:  ADVAIR  Inhale 1 puff into the lungs every 12 (twelve) hours.     predniSONE 20 MG tablet  Commonly known as:  DELTASONE  Take 2 tablets a day for 2 days, then one tablet a day for 2 days, then stop        Day of Discharge BP 116/71  Pulse  95  Temp(Src) 98 F (36.7 C) (Oral)  Resp 20  Ht 5\' 6"  (1.676 m)  Wt 70 kg (154 lb 5.2 oz)  BMI 24.92 kg/m2  SpO2 91%  Physical Exam: General: No acute respiratory distress on supplemental O2 Lungs: Clear to auscultation bilaterally without wheezes or crackles at time of d/c  Cardiovascular: Regular rate and rhythm without murmur gallop or rub normal S1 and S2 Abdomen: Nontender, nondistended, soft, bowel sounds positive, no rebound, no ascites, no appreciable mass Extremities: No significant cyanosis, clubbing, or edema bilateral lower extremities   Time spent in discharge (includes decision making & examination of pt): >30 minutes  07/29/2013, 11:33 AM   Lonia Blood, MD [facility] Office  [phone] Pager [phone]  On-Call/Text Page:      [email or web address]      password [facility]

## 2013-08-03 ENCOUNTER — Ambulatory Visit (INDEPENDENT_AMBULATORY_CARE_PROVIDER_SITE_OTHER): Payer: Medicare Other | Admitting: Internal Medicine

## 2013-08-03 ENCOUNTER — Encounter: Payer: Self-pay | Admitting: Internal Medicine

## 2013-08-03 VITALS — BP 132/68 | HR 74 | Temp 98.6°F | Ht 65.0 in | Wt 157.0 lb

## 2013-08-03 DIAGNOSIS — J9610 Chronic respiratory failure, unspecified whether with hypoxia or hypercapnia: Secondary | ICD-10-CM

## 2013-08-03 DIAGNOSIS — J449 Chronic obstructive pulmonary disease, unspecified: Secondary | ICD-10-CM

## 2013-08-03 MED ORDER — TIOTROPIUM BROMIDE MONOHYDRATE 18 MCG IN CAPS: 18.0000 ug | ORAL_CAPSULE | Freq: Every day | RESPIRATORY_TRACT | Status: DC

## 2013-08-03 NOTE — Patient Instructions (Addendum)
Start spiriva one capsule each am  Continue advair twice daily   Work on inhaler technique:  relax and gently blow all the way out then take a nice smooth deep breath back in, triggering the inhaler at same time you start breathing in.  Hold for up to 5 seconds if you can.  Rinse and gargle with water when done  Only use your albuterol as a rescue medication to be used if you can't catch your breath by resting or doing a relaxed purse lip breathing pattern.  - The less you use it, the better it will work when you need it. - Ok to use up to every 4 hours if you must but call for immediate appointment if use goes up over your usual need - Don't leave home without it !!  (think of it like your spare tire for your car)   Reduce 02 to 2lpm 24/ 7   Please schedule a follow up office visit in 4 weeks with PFTs, sooner if needed

## 2013-08-03 NOTE — Progress Notes (Signed)
Subjective:     Patient ID: Morgan [name], female   DOB: 12-17-1945, 67 y.o.   MRN: [id number]  HPI  Date of Admission: 07/23/2013  Date of Discharge: 07/29/2013    PULMONARY  Acute respiratory failure  COPD  Active smoker  P:  Ok off bipap x 24h, ok for floor  O2 to keep sats 88-92%  BD  Systemic steroids  Smoking cessation  Will need outpt pulmonary f/u   CARDIOVASCULAR  Hx HTN  CAD  P:  Monitor  Holding home ACE> not a good choice for a pt with unstable airway issues so rec permanently d/c   RENAL  Hyponatremia - mild  P:  F/u chem  Gentle fluids    HEMATOLOGIC  Polycythemia, resolved        08/03/2013 1st [facility] office visit/post hosp f/u/  Morgan [name] moved from [facility] April on advair 500  with saba daily only "when overdid it" eg with steps but able to walk at mall slowly and across parking lot but stopped smoking on admit p acute decomp in setting of  URI  rx with 02 / nebs/abs/last prednisone 08/02/13   No obvious day to day or daytime variabilty or assoc chronic cough or cp or chest tightness, subjective wheeze overt sinus or hb symptoms. No unusual exp hx or h/o childhood pna/ asthma or knowledge of premature birth.  Sleeping ok without nocturnal  or early am exacerbation  of respiratory  c/o's or need for noct saba. Also denies any obvious fluctuation of symptoms with weather or environmental changes or other aggravating or alleviating factors except as outlined above   Current Medications, Allergies, Complete Past Medical History, Past Surgical History, Family History, and Social History were reviewed in [employer] record.  ROS  The following are not active complaints unless bolded sore throat, dysphagia, dental problems, itching, sneezing,  nasal congestion or excess/ purulent secretions, ear ache,   fever, chills, sweats, unintended wt loss, pleuritic or exertional cp, hemoptysis,  orthopnea pnd or leg swelling, presyncope,  palpitations, heartburn, abdominal pain, anorexia, nausea, vomiting, diarrhea  or change in bowel or urinary habits, change in stools or urine, dysuria,hematuria,  rash, arthralgias, visual complaints, headache, numbness weakness or ataxia or problems with walking or coordination,  change in mood/affect or memory.          Review of Systems     Objective:   Physical Exam  Pleasant amb wf nad on 02    Wt Readings from Last 3 Encounters:  08/03/13 157 lb (71.215 kg)  07/23/13 154 lb 5.2 oz (70 kg)        HEENT mild turbinate edema.  Oropharynx no thrush or excess pnd or cobblestoning.  No JVD or cervical adenopathy. Mild accessory muscle hypertrophy. Trachea midline, nl thryroid. Chest was hyperinflated by percussion with diminished breath sounds and moderate increased exp time without wheeze. Hoover sign positive at mid inspiration. Regular rate and rhythm without murmur gallop or rub or increase P2 or edema.  Abd: no hsm, nl excursion. Ext warm without cyanosis or clubbing.     cxr 07/28/13 Severe emphysema. Mild right basilar atelectasis.  Assessment:

## 2013-08-04 DIAGNOSIS — J9610 Chronic respiratory failure, unspecified whether with hypoxia or hypercapnia: Secondary | ICD-10-CM | POA: Insufficient documentation

## 2013-08-04 NOTE — Assessment & Plan Note (Signed)
DDX of  difficult airways managment all start with A and  include Adherence, Ace Inhibitors, Acid Reflux, Active Sinus Disease, Alpha 1 Antitripsin deficiency, Anxiety masquerading as Airways dz,  ABPA,  allergy(esp in young), Aspiration (esp in elderly), Adverse effects of DPI,  Active smokers, plus two Bs  = Bronchiectasis and Beta blocker use..and one C= CHF  Adherence is always the initial "prime suspect" and is a multilayered concern that requires a "trust but verify" approach in every patient - starting with knowing how to use medications, especially inhalers, correctly, keeping up with refills and understanding the fundamental difference between maintenance and prns vs those medications only taken for a very short course and then stopped and not refilled.   The proper method of use, as well as anticipated side effects, of a metered-dose inhaler are discussed and demonstrated to the patient. Improved effectiveness after extensive coaching during this visit to a level of approximately  50% so best to continue dpi in form of advair and spiriva pending f/u with pft's off steroids  See instructions for specific recommendations which were reviewed directly with the patient who was given a copy with highlighter outlining the key components.

## 2013-08-04 NOTE — Assessment & Plan Note (Signed)
-   placed on 02 new at discharge from [facility] 07/2013 @ 3lpm - 08/03/2013  Walked 2lpm x 3 laps @ 185 ft each stopped due to end of study no desat  Will change rx to 2lpm 24/7 for now

## 2013-08-10 ENCOUNTER — Telehealth: Payer: Self-pay | Admitting: Internal Medicine

## 2013-08-10 NOTE — Telephone Encounter (Signed)
Ok to schedule in any 30" visit for HFU/NP as requested where there is space And cancel new pt appt in 10/2013 when HFU is scheduled

## 2013-08-10 NOTE — Telephone Encounter (Signed)
Pt request to be work in for a hospital fu withink 1 week. Pt has a new pt schedule in 10/2013. Pt stated that she can not drive, so the date she can come is Monday, Wednesday and Friday. Please advise.

## 2013-08-10 NOTE — Telephone Encounter (Signed)
Pt has an appt with Dr. [name] on 08/21/13.

## 2013-08-13 ENCOUNTER — Telehealth: Payer: Self-pay | Admitting: Internal Medicine

## 2013-08-13 DIAGNOSIS — J449 Chronic obstructive pulmonary disease, unspecified: Secondary | ICD-10-CM

## 2013-08-13 NOTE — Telephone Encounter (Signed)
Called, spoke with pt.  Informed her of MW's recs.  She verbalized understanding of this and would like to proceed with ONO on RA.  I have placed order for this.  Pt aware she will hear from DME to set this up.  She voiced no further questions or concerns at this time.

## 2013-08-13 NOTE — Telephone Encounter (Signed)
I spoke with pt. She reports she has been doing well. Her O2 canula came off the other night and knows she slept without it on. She woke up feeling fine. She wants to know if she can use it PRN. Please advise MW thanks

## 2013-08-13 NOTE — Telephone Encounter (Signed)
Best way to  Be sure is just repeat ono RA

## 2013-08-14 ENCOUNTER — Ambulatory Visit: Payer: Medicare Other | Admitting: Internal Medicine

## 2013-08-21 ENCOUNTER — Other Ambulatory Visit: Payer: Self-pay | Admitting: Internal Medicine

## 2013-08-21 ENCOUNTER — Encounter: Payer: Self-pay | Admitting: Internal Medicine

## 2013-08-21 ENCOUNTER — Ambulatory Visit (INDEPENDENT_AMBULATORY_CARE_PROVIDER_SITE_OTHER): Payer: Medicare Other | Admitting: Internal Medicine

## 2013-08-21 VITALS — BP 142/82 | HR 86 | Temp 97.9°F | Ht 65.0 in | Wt 156.8 lb

## 2013-08-21 DIAGNOSIS — I2781 Cor pulmonale (chronic): Secondary | ICD-10-CM

## 2013-08-21 DIAGNOSIS — I279 Pulmonary heart disease, unspecified: Secondary | ICD-10-CM

## 2013-08-21 DIAGNOSIS — J449 Chronic obstructive pulmonary disease, unspecified: Secondary | ICD-10-CM

## 2013-08-21 DIAGNOSIS — J9610 Chronic respiratory failure, unspecified whether with hypoxia or hypercapnia: Secondary | ICD-10-CM

## 2013-08-21 DIAGNOSIS — I10 Essential (primary) hypertension: Secondary | ICD-10-CM

## 2013-08-21 DIAGNOSIS — F411 Generalized anxiety disorder: Secondary | ICD-10-CM

## 2013-08-21 MED ORDER — LOSARTAN POTASSIUM 50 MG PO TABS: 50.0000 mg | ORAL_TABLET | Freq: Every day | ORAL | Status: DC

## 2013-08-21 MED ORDER — BUSPIRONE HCL 5 MG PO TABS: 5.0000 mg | ORAL_TABLET | Freq: Three times a day (TID) | ORAL | Status: DC | PRN

## 2013-08-21 MED ORDER — CITALOPRAM HYDROBROMIDE 20 MG PO TABS: 20.0000 mg | ORAL_TABLET | Freq: Every day | ORAL | Status: DC

## 2013-08-21 NOTE — Progress Notes (Signed)
Subjective:    Patient ID: Morgan [name], female    DOB: [birth year], 67 y.o.   MRN: [id number]  HPI  New patient to me, here to establish primary care provider Also here for hospital followup At [facility] October 23-28   Reviewed chronic medical issues: COPD, hypoxic resp failure - dx during 1st flare 07/2013 hosp with cor pulmonale. Following with pulm for same Taking pulm meds as rx'd and denies shortness of breath or edema.Uses O2 qhs and prn  hypertension - on ACEI for years until 07/2013 hosp for COPD and ACEI DC'f by pulm - ? Need for other medication. Denies chest pain, edema, headache or prior CAD/CVA  Anxiety - overlap with depression - largely situational - wishes to avoid habit forming meds - Began SSRI 07/2013 hosp, she reports compliance as prescribed. Denies adverse side effects. ?when to use Buspar and requests refills  Past Medical History  Diagnosis Date  . Asthma   . Goiter   . Hemorrhoids   . COPD (chronic obstructive pulmonary disease)     chronic hypoxic resp failure, O2 dep since 07/2013 hosp  . Hypertension   . Hyperlipidemia   . Psoriasis   . Anxiety    Family History  Problem Relation Age of Onset  . Heart disease Mother   . Hyperlipidemia Mother   . Hypertension Mother   . Cancer Father   . Lung cancer Father   . Esophageal cancer Maternal Grandmother   . Esophageal cancer Maternal Grandfather    History  Substance Use Topics  . Smoking status: Former Smoker    Quit date: 07/15/2013  . Smokeless tobacco: Not on file     Comment: Maritial Status: Divorced, 2 children  . Alcohol Use: Yes     Comment: states quit smoking last week   Review of Systems  Constitutional: Negative for fatigue and unexpected weight change.  Respiratory: Positive for cough and shortness of breath (chronic, esp DOE). Negative for wheezing.   Cardiovascular: Negative for chest pain, palpitations and leg swelling.  Gastrointestinal: Negative for nausea, abdominal pain and  diarrhea.  Neurological: Negative for dizziness, weakness, light-headedness and headaches.  Psychiatric/Behavioral: Negative for dysphoric mood. The patient is not nervous/anxious.   All other systems reviewed and are negative.       Objective:   Physical Exam BP 142/82  Pulse 86  Temp(Src) 97.9 F (36.6 C) (Oral)  Ht 5\' 5"  (1.651 m)  Wt 156 lb 12.8 oz (71.124 kg)  BMI 26.09 kg/m2  SpO2 93% Wt Readings from Last 3 Encounters:  08/21/13 156 lb 12.8 oz (71.124 kg)  08/03/13 157 lb (71.215 kg)  07/23/13 154 lb 5.2 oz (70 kg)   Constitutional: She appears well-developed and well-nourished. No distress.  HENT: Head: Normocephalic and atraumatic. Ears: B TMs ok, no erythema or effusion; Nose: Nose normal. Mouth/Throat: Oropharynx is clear and moist. No oropharyngeal exudate.  Eyes: Conjunctivae and EOM are normal. Pupils are equal, round, and reactive to light. No scleral icterus.  Neck: Normal range of motion. Neck supple. No JVD present. No thyromegaly present.  Cardiovascular: Normal rate, regular rhythm and normal heart sounds.  No murmur heard. No BLE edema. Pulmonary/Chest: Effort normal and breath sounds normal. No respiratory distress. She has no wheezes.  Abdominal: Soft. Bowel sounds are normal. She exhibits no distension. There is no tenderness. no masses Musculoskeletal: Normal range of motion, no joint effusions. No gross deformities Neurological: She is alert and oriented to person, place, and time. No cranial nerve  deficit. Coordination, balance, strength, speech and gait are normal.  Skin: Skin is warm and dry. No rash noted. No erythema.  Psychiatric: She has a normal mood and affect. Her behavior is normal. Judgment and thought content normal.   Lab Results  Component Value Date   WBC 8.3 07/27/2013   HGB 13.5 07/27/2013   HCT 39.6 07/27/2013   PLT 225 07/27/2013   GLUCOSE 101* 07/28/2013   CHOL 202* 12/11/2012   TRIG 85 12/11/2012   HDL 56 12/11/2012   LDLCALC  123 12/11/2012   ALT 19 12/11/2012   AST 13 12/11/2012   NA 135 07/28/2013   K 4.3 07/28/2013   CL 94* 07/28/2013   CREATININE 0.64 07/28/2013   BUN 16 07/28/2013   CO2 35* 07/28/2013   TSH 1.78 12/11/2012   INR 1.06 07/24/2013        Assessment & Plan:   See problem list. Medications and labs reviewed today.  Time spent with pt today 45 minutes, greater than 50% time spent counseling patient on COPD, anxiety, hypertension and medication review. Also review of prior records

## 2013-08-21 NOTE — Patient Instructions (Addendum)
It was good to see you today.  We have reviewed your prior records including labs and tests today  Medications reviewed and updated,  continue celexa and use Buspar if needed - refills today Start losartan in place of prior Lotensin for blood pressure control No other changes recommended at this time.  Your prescription(s) have been submitted to your pharmacy. Please take as directed and contact our office if you believe you are having problem(s) with the medication(s).  Please schedule followup in 6-8 weeks for blood pressure recheck and Shingles shot, call sooner if problems.  Hypertension As your heart beats, it forces blood through your arteries. This force is your blood pressure. If the pressure is too high, it is called hypertension (HTN) or high blood pressure. HTN is dangerous because you may have it and not know it. High blood pressure may mean that your heart has to work harder to pump blood. Your arteries may be narrow or stiff. The extra work puts you at risk for heart disease, stroke, and other problems.  Blood pressure consists of two numbers, a higher number over a lower, 110/72, for example. It is stated as "110 over 72." The ideal is below 120 for the top number (systolic) and under 80 for the bottom (diastolic). Write down your blood pressure today. You should pay close attention to your blood pressure if you have certain conditions such as:  Heart failure.  Prior heart attack.  Diabetes  Chronic kidney disease.  Prior stroke.  Multiple risk factors for heart disease. To see if you have HTN, your blood pressure should be measured while you are seated with your arm held at the level of the heart. It should be measured at least twice. A one-time elevated blood pressure reading (especially in the Emergency Department) does not mean that you need treatment. There may be conditions in which the blood pressure is different between your right and left arms. It is important to  see your caregiver soon for a recheck. Most people have essential hypertension which means that there is not a specific cause. This type of high blood pressure may be lowered by changing lifestyle factors such as:  Stress.  Smoking.  Lack of exercise.  Excessive weight.  Drug/tobacco/alcohol use.  Eating less salt. Most people do not have symptoms from high blood pressure until it has caused damage to the body. Effective treatment can often prevent, delay or reduce that damage. TREATMENT  When a cause has been identified, treatment for high blood pressure is directed at the cause. There are a large number of medications to treat HTN. These fall into several categories, and your caregiver will help you select the medicines that are best for you. Medications may have side effects. You should review side effects with your caregiver. If your blood pressure stays high after you have made lifestyle changes or started on medicines,   Your medication(s) may need to be changed.  Other problems may need to be addressed.  Be certain you understand your prescriptions, and know how and when to take your medicine.  Be sure to follow up with your caregiver within the time frame advised (usually within two weeks) to have your blood pressure rechecked and to review your medications.  If you are taking more than one medicine to lower your blood pressure, make sure you know how and at what times they should be taken. Taking two medicines at the same time can result in blood pressure that is too low.  SEEK IMMEDIATE MEDICAL CARE IF:  You develop a severe headache, blurred or changing vision, or confusion.  You have unusual weakness or numbness, or a faint feeling.  You have severe chest or abdominal pain, vomiting, or breathing problems. MAKE SURE YOU:   Understand these instructions.  Will watch your condition.  Will get help right away if you are not doing well or get worse. Document Released:  09/17/2005 Document Revised: 12/10/2011 Document Reviewed: 05/07/2008 [facility] Patient Information 2014 [place], [place].

## 2013-08-21 NOTE — Progress Notes (Signed)
Pre-visit discussion using our clinic review tool. No additional management support is needed unless otherwise documented below in the visit note.  

## 2013-08-22 NOTE — Assessment & Plan Note (Signed)
Chronic hypoxic resp failure - initially dx during exacerbation of same with cor pulmonale 07/2013 hosp O2 dep, qhs and prn spiriva added to advair early 08/2013 Reports rare use of Alb MDI since DC home The current medical regimen is effective;  continue present plan and medications.

## 2013-08-22 NOTE — Assessment & Plan Note (Signed)
BP Readings from Last 3 Encounters:  08/21/13 142/82  08/03/13 132/68  07/29/13 104/61   generallly well controlled on prior ACEI, but stopped 07/2013 by pulm due to new dx COPD and potential for cough. Start ARB now - we reviewed potential risk/benefit and possible side effects - pt understands and agrees to same  erx done Recheck 6 weeks to monitor, titrate as needed

## 2013-08-22 NOTE — Assessment & Plan Note (Signed)
Largely situational Denies si/hi or overwhelming depression symptoms (but overlaps with same) Started SSRI 07/2013 during hosp for COPD flare - doing well Also use prn buspar in place of prn "BZ" as pt wishes to avoid potentially habit forming meds Refills on same provided today The current medical regimen is effective;  continue present plan and medications.   

## 2013-08-24 LAB — FECAL OCCULT BLOOD, GUAIAC: Fecal Occult Blood: NEGATIVE

## 2013-08-26 ENCOUNTER — Telehealth: Payer: Self-pay | Admitting: Internal Medicine

## 2013-08-26 DIAGNOSIS — J9610 Chronic respiratory failure, unspecified whether with hypoxia or hypercapnia: Secondary | ICD-10-CM

## 2013-08-26 NOTE — Telephone Encounter (Signed)
ONO to be cancelled for now-- repeat to be determined at f/u visit. Pt made aware.

## 2013-08-26 NOTE — Telephone Encounter (Signed)
Pt states that she has not used O2 x 1 week Pt states that she has not had to use HFA at all since being off O2. Pt keeps O2 available but has not had to use.  Pt states that she wishes to D/C O2 bc she does not need it but will still come in and discuss this with MW face-to-face.  Pt states that she is supposed to have another ONO done on O2 and wants to know if she NEEDS to have this done since she is not wanting to use the O2. Pt plans to keep f/u visit with MW 09/03/13 and PFT.  MW please advise if we need to go ahead and D/C the ONO since patient is stating that she is not planning on wearing the O2 any longer(has not used x 1 week)

## 2013-08-26 NOTE — Telephone Encounter (Signed)
Ok to [employer] for now

## 2013-08-26 NOTE — Telephone Encounter (Signed)
I called and she reports she had reported she taped the finger piece for ONO on her finger bc it kept coming off. She reports this could have affected her results. Pt saw PCP on Friday and did not have O2 on and RA sats was fine. Pt is not wanting the O2. She has nothad to use her rescue inhaler. Please advise MW thanks

## 2013-08-26 NOTE — Telephone Encounter (Signed)
It would be better to wait until next ov to regroup on the 02 issues because she has cor pulmonale and the rx is 02 whether she thinks it helps or now but if she refuses just d/c it and document

## 2013-09-01 ENCOUNTER — Encounter: Payer: Self-pay | Admitting: Internal Medicine

## 2013-09-03 ENCOUNTER — Ambulatory Visit (INDEPENDENT_AMBULATORY_CARE_PROVIDER_SITE_OTHER): Payer: Medicare Other | Admitting: Internal Medicine

## 2013-09-03 ENCOUNTER — Encounter: Payer: Self-pay | Admitting: Internal Medicine

## 2013-09-03 ENCOUNTER — Telehealth: Payer: Self-pay | Admitting: Internal Medicine

## 2013-09-03 VITALS — BP 144/82 | HR 88 | Temp 97.9°F | Ht 64.5 in | Wt 160.0 lb

## 2013-09-03 DIAGNOSIS — J9610 Chronic respiratory failure, unspecified whether with hypoxia or hypercapnia: Secondary | ICD-10-CM

## 2013-09-03 DIAGNOSIS — J449 Chronic obstructive pulmonary disease, unspecified: Secondary | ICD-10-CM

## 2013-09-03 DIAGNOSIS — I10 Essential (primary) hypertension: Secondary | ICD-10-CM

## 2013-09-03 LAB — PULMONARY FUNCTION TEST

## 2013-09-03 MED ORDER — FLUTICASONE-SALMETEROL 250-50 MCG/DOSE IN AEPB: 1.0000 | INHALATION_SPRAY | Freq: Two times a day (BID) | RESPIRATORY_TRACT | Status: DC

## 2013-09-03 MED ORDER — TIOTROPIUM BROMIDE MONOHYDRATE 18 MCG IN CAPS: 18.0000 ug | ORAL_CAPSULE | Freq: Every day | RESPIRATORY_TRACT | Status: DC

## 2013-09-03 NOTE — Patient Instructions (Signed)
You will need to let Dr [name] know about the cough on cozaar as it has been reported though rare.  Other medications from that class are almost always fine to use but you'll need to pick one from your formulary  The high dose advair can also make you cough so change to advair 250/50 one twice daily   Work on inhaler technique:  relax and gently blow all the way out then take a nice smooth deep breath back in, triggering the inhaler at same time you start breathing in.  Hold for up to 5 seconds if you can.  Rinse and gargle with water when done  Congratulations on not smoking !  Please schedule a follow up visit in 3 months but call sooner if needed to consider simplifying your regimen

## 2013-09-03 NOTE — Progress Notes (Signed)
PFT done today. 

## 2013-09-03 NOTE — Progress Notes (Signed)
Subjective:     Patient ID: Morgan [name], female   DOB: 07/09/1946.   MRN: [id number]   Brief patient profile:   67 yowf quit smoking 07/15/13 with documented GOLD III COPD 09/03/13 referred to pulmonary clinic p admit:  Date of Admission: 07/23/2013  Date of Discharge: 07/29/2013    History of Present Illness   PULMONARY  Acute respiratory failure  COPD  Active smoker  P:  Ok off bipap x 24h, ok for floor  O2 to keep sats 88-92%  BD  Systemic steroids  Smoking cessation  Will need outpt pulmonary f/u   CARDIOVASCULAR  Hx HTN  CAD  P:  Monitor  Holding home ACE> not a good choice for a pt with unstable airway issues so rec permanently d/c   RENAL  Hyponatremia - mild  P:  F/u chem  Gentle fluids    HEMATOLOGIC  Polycythemia, resolved       08/03/2013 1st [facility] office visit/post hosp f/u/  Morgan [name] moved from [facility] April on advair 500  with saba daily only "when overdid it" eg with steps but able to walk at mall slowly and across parking lot but stopped smoking on admit p acute decomp in setting of  URI  rx with 02 / nebs/abs/last prednisone 08/02/13 rec Start spiriva one capsule each am Continue advair twice daily  Work on inhaler technique:   Only use your albuterol as a rescue medication r spare tire for your car)  Reduce 02 to 2lpm 24/ 7    09/03/2013 f/u ov/Morgan [name] re: COPD III/ 02 dep Chief Complaint  Patient presents with  . Followup with PFT    Pt states her breathing is doing well.  Has not had to use rescue inhaler. [name] new co's today.   on advair 500/ spiriva thinks cozar is making her cough = dry, mostly daytime, assoc with mild haorseness.  Not limited from very low lever adls by sob   [name] obvious day to day or daytime variabilty or assod cp or chest tightness, subjective wheeze overt sinus or hb symptoms. [name] unusual exp hx or h/o childhood pna/ asthma or knowledge of premature birth.  Sleeping ok without nocturnal  or early am exacerbation   of respiratory  c/o's or need for noct saba. Also denies any obvious fluctuation of symptoms with weather or environmental changes or other aggravating or alleviating factors except as outlined above   Current Medications, Allergies, Complete Past Medical History, Past Surgical History, Family History, and Social History were reviewed in [employer] record.  ROS  The following are not active complaints unless bolded sore throat, dysphagia, dental problems, itching, sneezing,  nasal congestion or excess/ purulent secretions, ear ache,   fever, chills, sweats, unintended wt loss, pleuritic or exertional cp, hemoptysis,  orthopnea pnd or leg swelling, presyncope, palpitations, heartburn, abdominal pain, anorexia, nausea, vomiting, diarrhea  or change in bowel or urinary habits, change in stools or urine, dysuria,hematuria,  rash, arthralgias, visual complaints, headache, numbness weakness or ataxia or problems with walking or coordination,  change in mood/affect or memory.               Objective:   Physical Exam  Pleasant amb wf nad [name] longer using 02  Wt Readings from Last 3 Encounters:  09/03/13 160 lb (72.576 kg)  08/21/13 156 lb 12.8 oz (71.124 kg)  08/03/13 157 lb (71.215 kg)       HEENT mild turbinate edema.  Oropharynx [name] thrush or  excess pnd or cobblestoning.  [name] JVD or cervical adenopathy. Mild accessory muscle hypertrophy. Trachea midline, nl thryroid. Chest was hyperinflated by percussion with diminished breath sounds and moderate increased exp time without wheeze. Hoover sign positive at mid inspiration. Regular rate and rhythm without murmur gallop or rub or increase P2 or edema.  Abd: [name] hsm, nl excursion. Ext warm without cyanosis or clubbing.     cxr 07/28/13 Severe emphysema. Mild right basilar atelectasis.  Assessment:

## 2013-09-03 NOTE — Telephone Encounter (Signed)
Pt calling to check and make sure that it is okay for her to resume taking the Advair and Spiriva.  Pt states that she was advised to HOLD these meds since she was having PFT today-- explained to the patient the reason for holding these meds this morning before the test.  Pt advised okay to resume both meds.  Nothing further needed.

## 2013-09-04 ENCOUNTER — Other Ambulatory Visit: Payer: Self-pay | Admitting: Internal Medicine

## 2013-09-04 ENCOUNTER — Encounter: Payer: Self-pay | Admitting: Internal Medicine

## 2013-09-04 DIAGNOSIS — J9610 Chronic respiratory failure, unspecified whether with hypoxia or hypercapnia: Secondary | ICD-10-CM

## 2013-09-04 NOTE — Telephone Encounter (Signed)
Dr. Wert please advise thanks 

## 2013-09-05 NOTE — Assessment & Plan Note (Signed)
Doubt the losartan is causing her cough but it has been anecdotally reported, defer rx to Dr [name]

## 2013-09-05 NOTE — Assessment & Plan Note (Signed)
-   placed on 02 new at discharge from [facility] 07/2013 @ 3lpm - 08/03/2013  Walked 2lpm x 3 laps @ 185 ft each stopped due to end of study no desat - Aug 17 2013 ONO RA dest x 122 min < 88%  - 08/26/2013 notified not using 02 and requesting it be d/c'd > officially d/c'd 09/04/2013 as not using at all

## 2013-09-05 NOTE — Assessment & Plan Note (Addendum)
-   hfa 50% 08/03/2013  - added spiriva 08/03/2013  - PFT's 09/03/2013 FEV1 0.86 (36%) ratio 47 and 14% p B2 and DLCO 45 with correction to 55 %  Much better overall off cigs and on triple combo rx with only remaining issue the  dry cough that I agree is not copd related but typical of an upper airway cough triggered by high dose advair, not cozaar(though has been reported)  rec try reduce advair 250/50 one bid  The proper method of use, as well as anticipated side effects, of a metered-dose inhaler are discussed and demonstrated to the patient. Improved effectiveness after extensive coaching during this visit to a level of approximately  75% vs with dpi 90% - if cough continues could safely change to hfa symbicort   See instructions for specific recommendations which were reviewed directly with the patient who was given a copy with highlighter outlining the key components.

## 2013-09-08 ENCOUNTER — Encounter: Payer: Self-pay | Admitting: Internal Medicine

## 2013-09-16 ENCOUNTER — Encounter: Payer: Self-pay | Admitting: Internal Medicine

## 2013-10-16 ENCOUNTER — Ambulatory Visit (INDEPENDENT_AMBULATORY_CARE_PROVIDER_SITE_OTHER): Payer: Medicare Other | Admitting: Internal Medicine

## 2013-10-16 ENCOUNTER — Encounter: Payer: Self-pay | Admitting: Internal Medicine

## 2013-10-16 VITALS — BP 140/72 | HR 78 | Temp 98.3°F | Wt 166.0 lb

## 2013-10-16 DIAGNOSIS — F411 Generalized anxiety disorder: Secondary | ICD-10-CM

## 2013-10-16 DIAGNOSIS — K219 Gastro-esophageal reflux disease without esophagitis: Secondary | ICD-10-CM

## 2013-10-16 DIAGNOSIS — Z23 Encounter for immunization: Secondary | ICD-10-CM

## 2013-10-16 DIAGNOSIS — I10 Essential (primary) hypertension: Secondary | ICD-10-CM

## 2013-10-16 DIAGNOSIS — Z13820 Encounter for screening for osteoporosis: Secondary | ICD-10-CM

## 2013-10-16 MED ORDER — ZOSTER VACCINE LIVE 19400 UNT/0.65ML ~~LOC~~ SOLR: 0.6500 mL | Freq: Once | SUBCUTANEOUS | Status: DC

## 2013-10-16 MED ORDER — BUSPIRONE HCL 5 MG PO TABS: 5.0000 mg | ORAL_TABLET | Freq: Three times a day (TID) | ORAL | Status: DC | PRN

## 2013-10-16 MED ORDER — OMEPRAZOLE 20 MG PO CPDR: 20.0000 mg | DELAYED_RELEASE_CAPSULE | Freq: Every day | ORAL | Status: DC

## 2013-10-16 NOTE — Assessment & Plan Note (Signed)
Largely situational Denies si/hi or overwhelming depression symptoms (but overlaps with same) Started SSRI 07/2013 during hosp for COPD flare - doing well Also use prn buspar in place of prn "BZ" as pt wishes to avoid potentially habit forming meds Refills on same provided today The current medical regimen is effective;  continue present plan and medications.

## 2013-10-16 NOTE — Progress Notes (Signed)
  Subjective:    Patient ID: Morgan [name], female    DOB: 27-Jan-1946, 68 y.o.   MRN: [id number]  HPI  Here for follow up - reviewed chronic medical issues and interval medical events:  COPD, hypoxic resp failure - dx during 1st flare 07/2013 hosp with cor pulmonale. Following with pulm for same Taking pulm meds as rx'd and denies shortness of breath or edema. Uses O2 qhs and prn  hypertension - on ACEI for years until 07/2013 hosp for COPD and ACEI DC'd by pulm - changed to ARB 08/2013 and doing well. Denies chest pain, edema, headache or prior CAD/CVA  Anxiety - overlap with depression - largely situational - wishes to avoid habit forming meds - Began SSRI 07/2013 hosp, she reports compliance as prescribed. Denies adverse side effects. uses Buspar prn and requests refills  Past Medical History  Diagnosis Date  . Asthma   . Goiter   . Hemorrhoids   . COPD (chronic obstructive pulmonary disease)     chronic hypoxic resp failure, O2 dep since 07/2013 hosp  . Hypertension   . Hyperlipidemia   . Psoriasis   . Anxiety     Review of Systems  Constitutional: Negative for fatigue and unexpected weight change.  Respiratory: Positive for cough and shortness of breath (chronic, esp DOE). Negative for wheezing.   Cardiovascular: Negative for chest pain, palpitations and leg swelling.  Gastrointestinal: Negative for nausea, abdominal pain and diarrhea.       Occ reflux/heartburn, worse with certain foods  Neurological: Negative for dizziness, weakness, light-headedness and headaches.  Psychiatric/Behavioral: Negative for dysphoric mood. The patient is not nervous/anxious.        Objective:   Physical Exam BP 140/72  Pulse 78  Temp(Src) 98.3 F (36.8 C) (Oral)  Wt 166 lb (75.297 kg)  SpO2 94% Wt Readings from Last 3 Encounters:  10/16/13 166 lb (75.297 kg)  09/03/13 160 lb (72.576 kg)  08/21/13 156 lb 12.8 oz (71.124 kg)   Constitutional: She appears well-developed and  well-nourished. No distress.  Neck: Normal range of motion. Neck supple. No JVD present. No thyromegaly present.  Cardiovascular: Normal rate, regular rhythm and normal heart sounds.  No murmur heard. No BLE edema. Pulmonary/Chest: Effort normal and breath sounds normal. No respiratory distress. She has no wheezes.  Abdomen: SNTND+BS Psychiatric: She has a normal mood and affect. Her behavior is normal. Judgment and thought content normal.   Lab Results  Component Value Date   WBC 8.3 07/27/2013   HGB 13.5 07/27/2013   HCT 39.6 07/27/2013   PLT 225 07/27/2013   GLUCOSE 101* 07/28/2013   CHOL 202* 12/11/2012   TRIG 85 12/11/2012   HDL 56 12/11/2012   LDLCALC 123 12/11/2012   ALT 19 12/11/2012   AST 13 12/11/2012   NA 135 07/28/2013   K 4.3 07/28/2013   CL 94* 07/28/2013   CREATININE 0.64 07/28/2013   BUN 16 07/28/2013   CO2 35* 07/28/2013   TSH 1.78 12/11/2012   INR 1.06 07/24/2013        Assessment & Plan:   See problem list. Medications and labs reviewed today.  Tetanus booster updated, refer for bone density

## 2013-10-16 NOTE — Assessment & Plan Note (Signed)
BP Readings from Last 3 Encounters:  10/16/13 140/72  09/03/13 144/82  08/21/13 142/82   generallly well controlled on prior ACEI, but stopped 07/2013 by pulm due to new dx COPD and potential for cough. Started ARB 08/2013- reports generally improved The current medical regimen is effective;  continue present plan and medications.

## 2013-10-16 NOTE — Assessment & Plan Note (Signed)
Mild symptoms Start PPI - erx done follow up if worse or unimproved

## 2013-10-16 NOTE — Patient Instructions (Signed)
It was good to see you today.  We have reviewed your prior records including labs and tests today  Medications reviewed and updated, Begin generic Prilosec once daily for reflux symptoms, no other changes recommended at this time.  efills provided today  Your prescription(s) have been submitted to your pharmacy. Please take as directed and contact our office if you believe you are having problem(s) with the medication(s).  Make referral for bone density to be scheduled at your convenience, tetanus booster also updated today and will check into shingles vaccination covered by your insurance  Please schedule followup in 6 months, call sooner if problems.   Hypertension As your heart beats, it forces blood through your arteries. This force is your blood pressure. If the pressure is too high, it is called hypertension (HTN) or high blood pressure. HTN is dangerous because you may have it and not know it. High blood pressure may mean that your heart has to work harder to pump blood. Your arteries may be narrow or stiff. The extra work puts you at risk for heart disease, stroke, and other problems.  Blood pressure consists of two numbers, a higher number over a lower, 110/72, for example. It is stated as "110 over 72." The ideal is below 120 for the top number (systolic) and under 80 for the bottom (diastolic). Write down your blood pressure today. You should pay close attention to your blood pressure if you have certain conditions such as:  Heart failure.  Prior heart attack.  Diabetes  Chronic kidney disease.  Prior stroke.  Multiple risk factors for heart disease. To see if you have HTN, your blood pressure should be measured while you are seated with your arm held at the level of the heart. It should be measured at least twice. A one-time elevated blood pressure reading (especially in the Emergency Department) does not mean that you need treatment. There may be conditions in which the  blood pressure is different between your right and left arms. It is important to see your caregiver soon for a recheck. Most people have essential hypertension which means that there is not a specific cause. This type of high blood pressure may be lowered by changing lifestyle factors such as:  Stress.  Smoking.  Lack of exercise.  Excessive weight.  Drug/tobacco/alcohol use.  Eating less salt. Most people do not have symptoms from high blood pressure until it has caused damage to the body. Effective treatment can often prevent, delay or reduce that damage. TREATMENT  When a cause has been identified, treatment for high blood pressure is directed at the cause. There are a large number of medications to treat HTN. These fall into several categories, and your caregiver will help you select the medicines that are best for you. Medications may have side effects. You should review side effects with your caregiver. If your blood pressure stays high after you have made lifestyle changes or started on medicines,   Your medication(s) may need to be changed.  Other problems may need to be addressed.  Be certain you understand your prescriptions, and know how and when to take your medicine.  Be sure to follow up with your caregiver within the time frame advised (usually within two weeks) to have your blood pressure rechecked and to review your medications.  If you are taking more than one medicine to lower your blood pressure, make sure you know how and at what times they should be taken. Taking two medicines at the  same time can result in blood pressure that is too low. SEEK IMMEDIATE MEDICAL CARE IF:  You develop a severe headache, blurred or changing vision, or confusion.  You have unusual weakness or numbness, or a faint feeling.  You have severe chest or abdominal pain, vomiting, or breathing problems. MAKE SURE YOU:   Understand these instructions.  Will watch your  condition.  Will get help right away if you are not doing well or get worse. Document Released: 09/17/2005 Document Revised: 12/10/2011 Document Reviewed: 05/07/2008 [facility] Patient Information 2014 [place].

## 2013-10-16 NOTE — Progress Notes (Signed)
Pre-visit discussion using our clinic review tool. No additional management support is needed unless otherwise documented below in the visit note.  

## 2013-10-17 ENCOUNTER — Telehealth: Payer: Self-pay | Admitting: Internal Medicine

## 2013-10-17 NOTE — Telephone Encounter (Signed)
Relevant patient education assigned to patient using Emmi. ° °

## 2013-10-19 ENCOUNTER — Ambulatory Visit: Payer: Self-pay | Admitting: Internal Medicine

## 2013-11-30 ENCOUNTER — Other Ambulatory Visit: Payer: Self-pay | Admitting: Internal Medicine

## 2014-02-12 ENCOUNTER — Other Ambulatory Visit: Payer: Self-pay | Admitting: Internal Medicine

## 2014-04-02 IMAGING — CT CT CHEST W/ CM
2 of 3 series · 15 of 36 positions shown, 18 images · IV contrast (APPLIED)
Comparison: Chest x-ray on 07/25/2013.

CLINICAL DATA: COPD, respiratory distress and possible left
pulmonary nodule by x-ray.

EXAM:
CT CHEST WITH CONTRAST
TECHNIQUE: Multidetector CT imaging of the chest was performed during
intravenous contrast administration.
CONTRAST:  75mL OMNIPAQUE IOHEXOL 300 MG/ML  SOLN

[Series 2: thorax 5.0 i31f 1 · axial · 0.64mm/px · z∈[-65,+180]mm · 12 of 59 slices shown, 15 images]
[im 5/59  mediastinal]
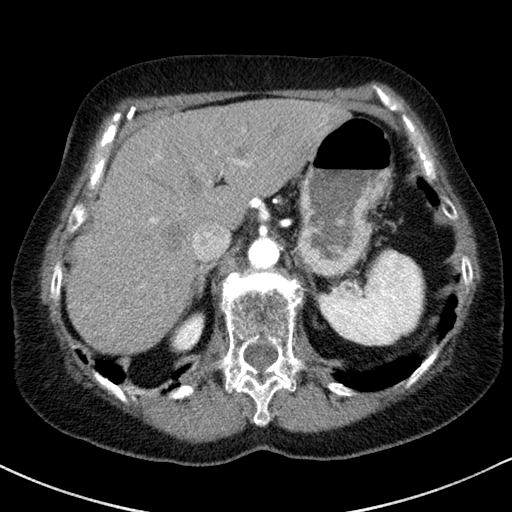
[im 5/59  lung]
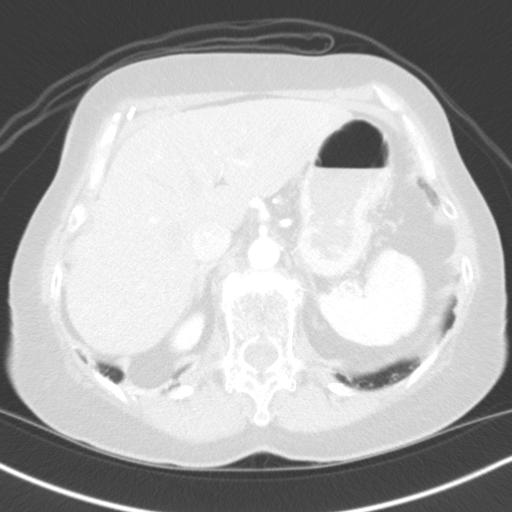
[im 9/59  lung]
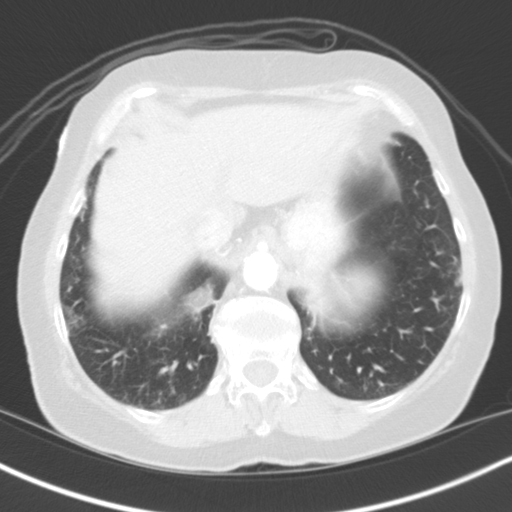
[im 13/59  lung]
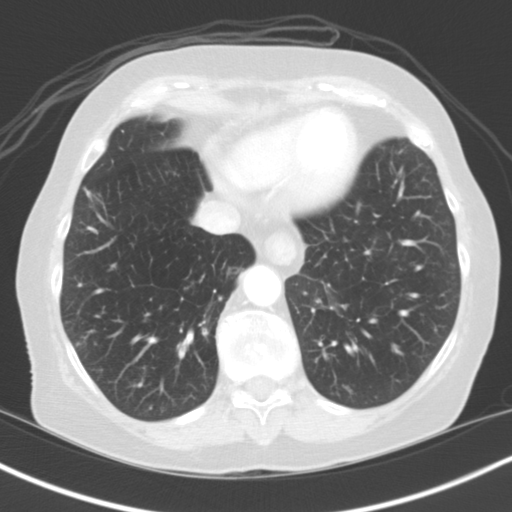
[im 18/59  lung]
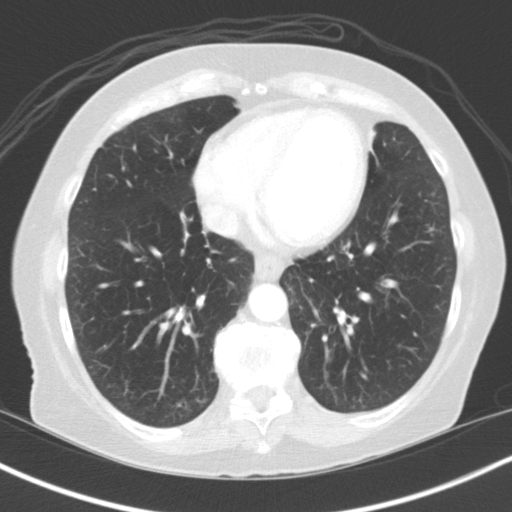
[im 22/59  mediastinal]
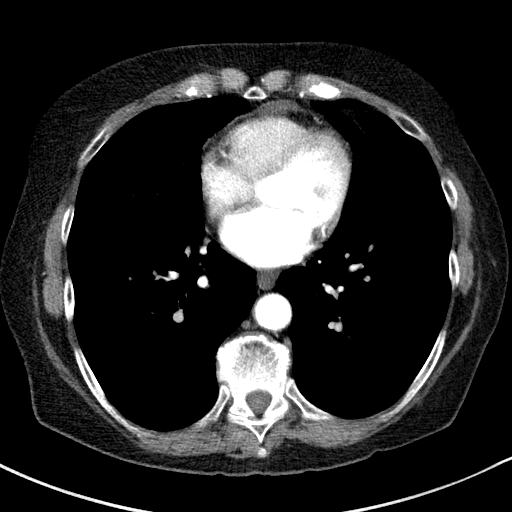
[im 22/59  lung]
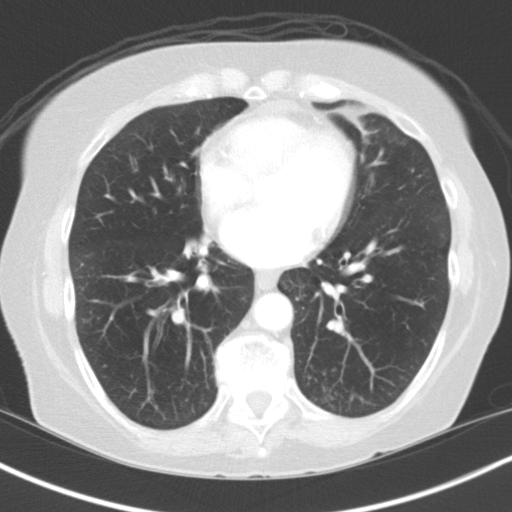
[im 26/59  lung]
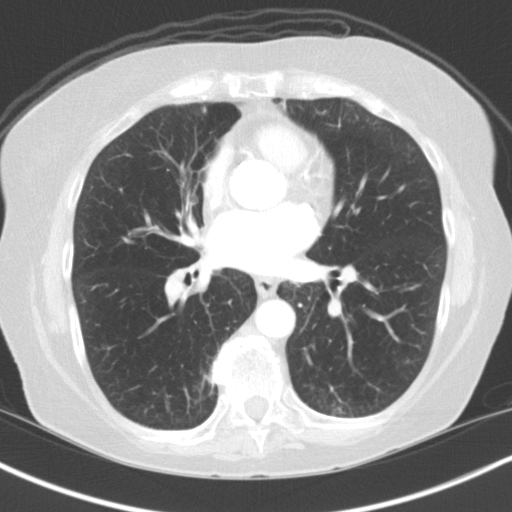
[im 33/59  lung]
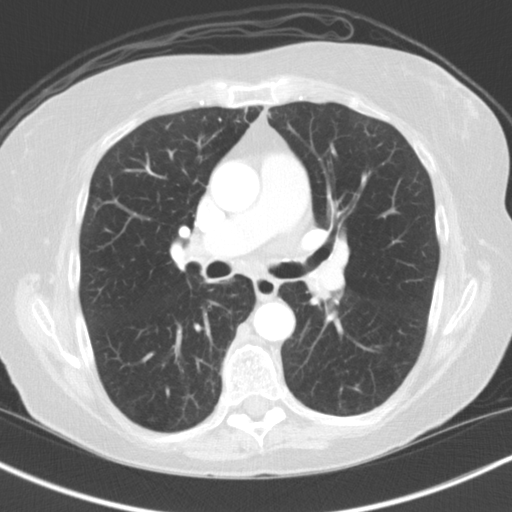
[im 37/59  lung]
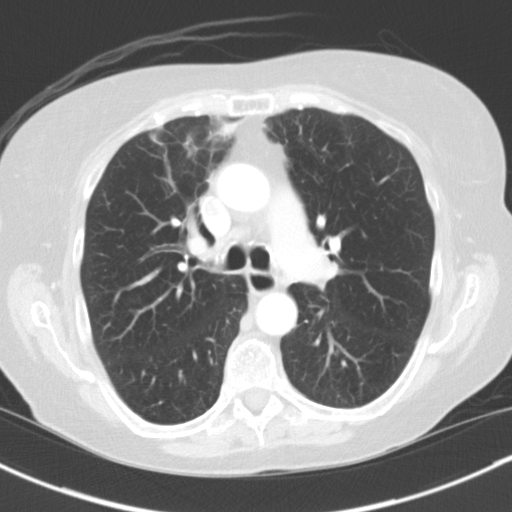
[im 41/59  mediastinal]
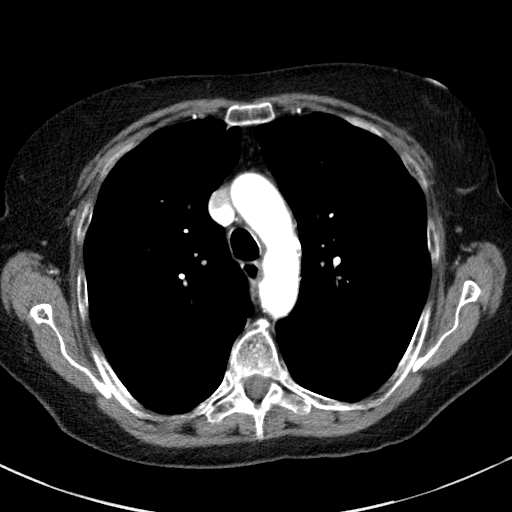
[im 41/59  lung]
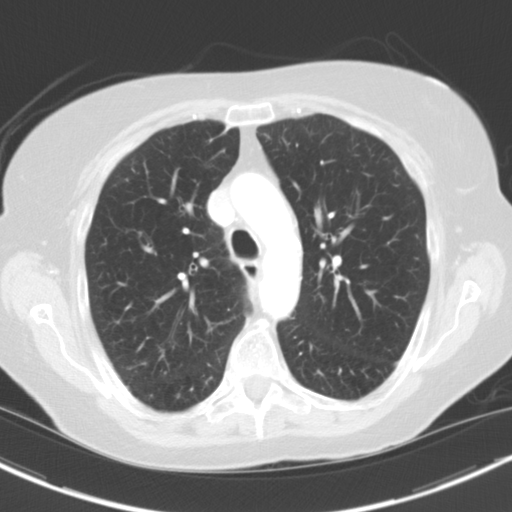
[im 46/59  lung]
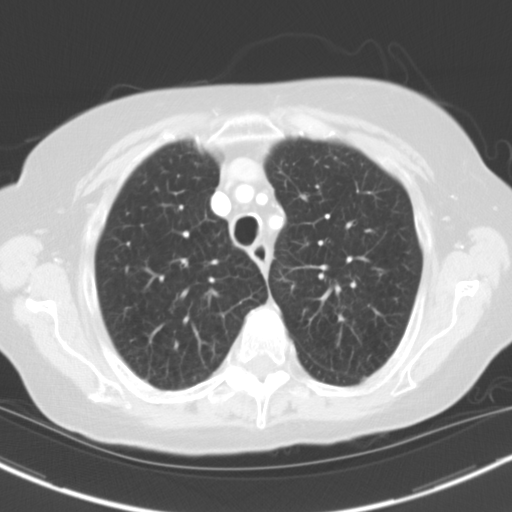
[im 50/59  lung]
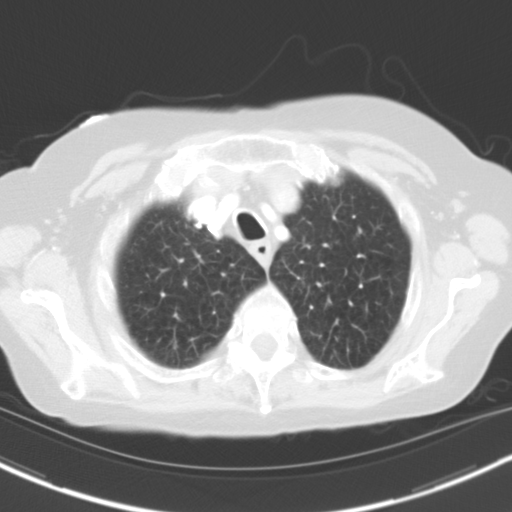
[im 54/59  lung]
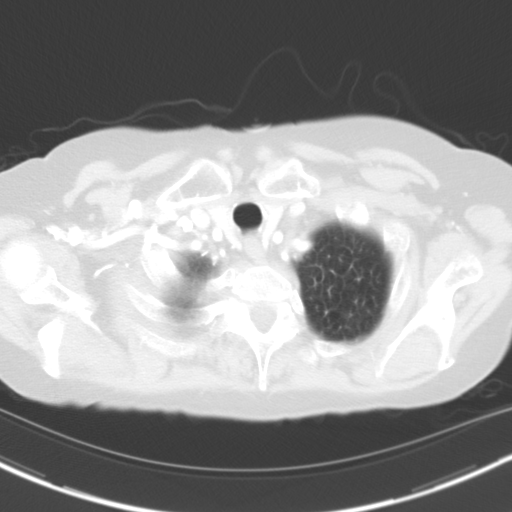

[Series 5: coronal · coronal · 0.59mm/px · 3 of 79 slices shown]
[im 16/79  lung]
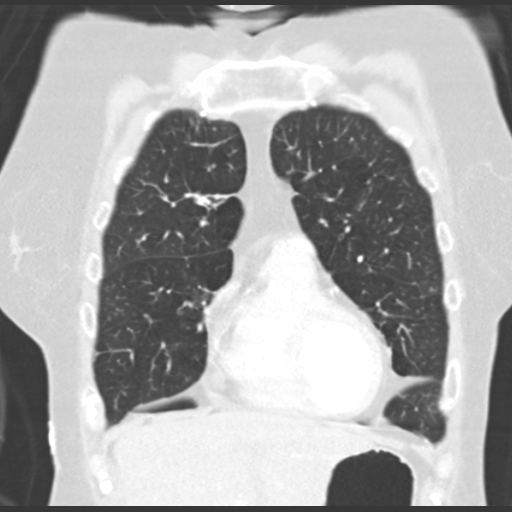
[im 32/79  lung]
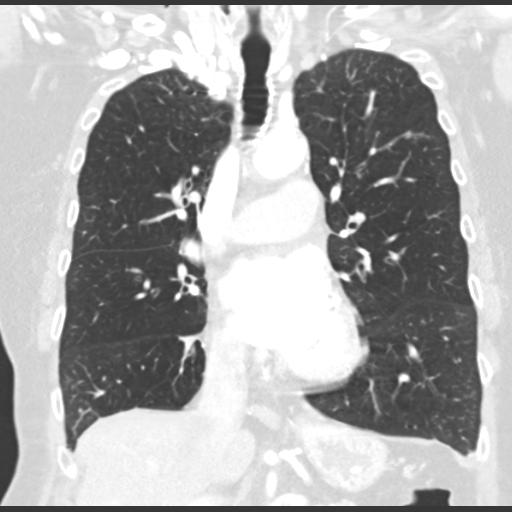
[im 47/79  lung]
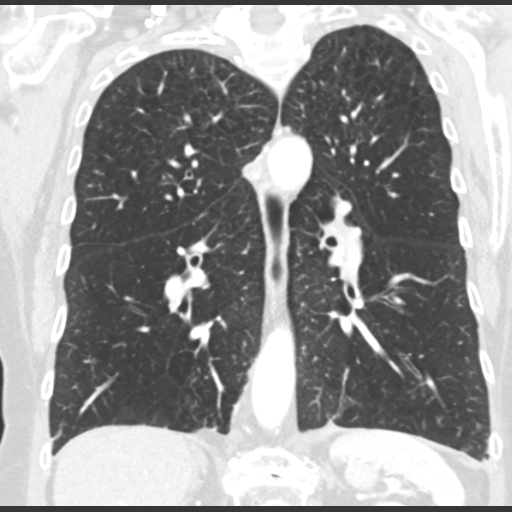

[15 of 36 positions shown; findings below may reference images not displayed]

FINDINGS: Underlying emphysematous lung disease present without discrete
nodule or mass identified. Areas of parenchymal scarring are seen
scattered throughout both lungs. No evidence of lymphadenopathy. No
pleural or pericardial fluid is identified. The visualized airways
are normally patent. There is no evidence of bronchiectasis. The
thoracic aorta is of normal caliber and demonstrates scattered
atherosclerotic plaque. Coronary arterial calcifications are seen
involving the left main coronary artery, LAD and left circumflex
artery.

Moderate degenerative changes are seen throughout the thoracic
spine. There is mild compression of the anterior aspect of the T12
vertebral body. Maximal loss of height is approximately 20%. No
retropulsion is identified.
IMPRESSION: 1. COPD/emphysema and pulmonary scarring without evidence of
pulmonary nodule or lymphadenopathy.
2. Coronary atherosclerosis with calcified plaque identified at the
level of the left main coronary artery, LAD and left circumflex
artery.
3. Mild anterior compression fracture of the T12 vertebral body with
approximately 20% loss of vertebral body height.

## 2014-04-08 ENCOUNTER — Ambulatory Visit (INDEPENDENT_AMBULATORY_CARE_PROVIDER_SITE_OTHER)
Admission: RE | Admit: 2014-04-08 | Discharge: 2014-04-08 | Disposition: A | Discharge status: Home / Self Care | Payer: Medicare Other | Source: Ambulatory Visit | Attending: Internal Medicine | Admitting: Internal Medicine

## 2014-04-08 DIAGNOSIS — Z13820 Encounter for screening for osteoporosis: Secondary | ICD-10-CM

## 2014-04-11 ENCOUNTER — Encounter: Payer: Self-pay | Admitting: Internal Medicine

## 2014-04-11 ENCOUNTER — Other Ambulatory Visit: Payer: Self-pay | Admitting: Internal Medicine

## 2014-04-11 DIAGNOSIS — M810 Age-related osteoporosis without current pathological fracture: Secondary | ICD-10-CM

## 2014-04-11 HISTORY — DX: Age-related osteoporosis without current pathological fracture: M81.0

## 2014-04-11 MED ORDER — ALENDRONATE SODIUM 70 MG PO TABS: 70.0000 mg | ORAL_TABLET | ORAL | Status: DC

## 2014-04-12 ENCOUNTER — Telehealth: Payer: Self-pay

## 2014-04-12 NOTE — Telephone Encounter (Signed)
Left message for pt to return call in regards to dexa results

## 2014-04-15 ENCOUNTER — Encounter: Payer: Self-pay | Admitting: Internal Medicine

## 2014-04-15 ENCOUNTER — Ambulatory Visit (INDEPENDENT_AMBULATORY_CARE_PROVIDER_SITE_OTHER): Payer: Medicare Other | Admitting: Internal Medicine

## 2014-04-15 VITALS — BP 126/72 | HR 81 | Temp 98.8°F | Ht 65.0 in | Wt 185.8 lb

## 2014-04-15 DIAGNOSIS — R5383 Other fatigue: Secondary | ICD-10-CM

## 2014-04-15 DIAGNOSIS — J449 Chronic obstructive pulmonary disease, unspecified: Secondary | ICD-10-CM

## 2014-04-15 DIAGNOSIS — R5381 Other malaise: Secondary | ICD-10-CM

## 2014-04-15 DIAGNOSIS — F411 Generalized anxiety disorder: Secondary | ICD-10-CM

## 2014-04-15 DIAGNOSIS — I10 Essential (primary) hypertension: Secondary | ICD-10-CM

## 2014-04-15 DIAGNOSIS — E785 Hyperlipidemia, unspecified: Secondary | ICD-10-CM

## 2014-04-15 DIAGNOSIS — R635 Abnormal weight gain: Secondary | ICD-10-CM

## 2014-04-15 NOTE — Progress Notes (Signed)
Pre visit review using our clinic review tool, if applicable. No additional management support is needed unless otherwise documented below in the visit note. 

## 2014-04-15 NOTE — Assessment & Plan Note (Signed)
BP Readings from Last 3 Encounters:  04/15/14 126/72  10/16/13 140/72  09/03/13 144/82   generallly well controlled on prior ACEI, but stopped 07/2013 by pulm due to new dx COPD and potential for cough. Started ARB 08/2013- reports generally improved The current medical regimen is effective;  continue present plan and medications.

## 2014-04-15 NOTE — Assessment & Plan Note (Signed)
Chronic hypoxic resp failure - initially dx during exacerbation of same with cor pulmonale 07/2013 hosp O2 dep, qhs and prn spiriva added to advair early 08/2013 - improved Reports rare use of Alb MDI  The current medical regimen is effective;  continue present plan and medications.

## 2014-04-15 NOTE — Assessment & Plan Note (Signed)
Largely situational Denies si/hi or overwhelming depression symptoms (but overlaps with same) Started SSRI 07/2013 during hosp for COPD flare - but indep stopped same Also used prn buspar in place of prn "BZ" as pt wishes to avoid potentially habit forming meds, but none recently - removed from med list

## 2014-04-15 NOTE — Progress Notes (Signed)
Subjective:    Patient ID: Morgan [name], female    DOB: 02-Feb-1946, 68 y.o.   MRN: [id number]  HPI  Here for 6 mo follow up -  reviewed chronic medical issues and interval medical events:  Past Medical History  Diagnosis Date  . Asthma   . Goiter   . Hemorrhoids   . COPD (chronic obstructive pulmonary disease)     chronic hypoxic resp failure, O2 dep since 07/2013 hosp  . Hypertension   . Hyperlipidemia   . Psoriasis   . Anxiety   . Osteoporosis 04/11/2014    DEXA @ [facility] 03/2014: -3.0   Review of Systems  Constitutional: Positive for unexpected weight change (30# weight gain since smoking cessation 07/2013). Negative for fatigue.  Respiratory: Positive for shortness of breath (chronic, esp DOE). Negative for cough and wheezing.   Cardiovascular: Negative for chest pain, palpitations and leg swelling.  Neurological: Negative for dizziness, weakness, light-headedness and headaches.  Hematological: Bruises/bleeds easily.  Psychiatric/Behavioral: Negative for dysphoric mood. The patient is not nervous/anxious.        Objective:   Physical Exam BP 126/72  Pulse 81  Temp(Src) 98.8 F (37.1 C) (Oral)  Ht 5\' 5"  (1.651 m)  Wt 185 lb 12.8 oz (84.278 kg)  BMI 30.92 kg/m2  SpO2 93% Wt Readings from Last 3 Encounters:  04/15/14 185 lb 12.8 oz (84.278 kg)  10/16/13 166 lb (75.297 kg)  09/03/13 160 lb (72.576 kg)   Constitutional: She is overweight, but appears well-developed and well-nourished. No distress.  Neck: Normal range of motion. Neck supple. No JVD present. No thyromegaly present.  Cardiovascular: Normal rate, regular rhythm and normal heart sounds.  No murmur heard. No BLE edema. Pulmonary/Chest: Effort normal and breath sounds normal. No respiratory distress. She has no wheezes.  Psychiatric: She has a normal mood and affect. Her behavior is normal. Judgment and thought content normal.   Lab Results  Component Value Date   WBC 8.3 07/27/2013   HGB 13.5 07/27/2013   HCT 39.6 07/27/2013   PLT 225 07/27/2013   GLUCOSE 101* 07/28/2013   CHOL 202* 12/11/2012   TRIG 85 12/11/2012   HDL 56 12/11/2012   LDLCALC 123 12/11/2012   ALT 19 12/11/2012   AST 13 12/11/2012   NA 135 07/28/2013   K 4.3 07/28/2013   CL 94* 07/28/2013   CREATININE 0.64 07/28/2013   BUN 16 07/28/2013   CO2 35* 07/28/2013   TSH 1.78 12/11/2012   INR 1.06 07/24/2013        Assessment & Plan:   Problem List Items Addressed This Visit   COPD GOLD III wih min reversibility     Chronic hypoxic resp failure - initially dx during exacerbation of same with cor pulmonale 07/2013 hosp O2 dep, qhs and prn spiriva added to advair early 08/2013 - improved Reports rare use of Alb MDI  The current medical regimen is effective;  continue present plan and medications.    Relevant Orders      CBC with Differential   GAD (generalized anxiety disorder)     Largely situational Denies si/hi or overwhelming depression symptoms (but overlaps with same) Started SSRI 07/2013 during hosp for COPD flare - but indep stopped same Also used prn buspar in place of prn "BZ" as pt wishes to avoid potentially habit forming meds, but none recently - removed from med list     Relevant Orders      TSH   Hyperlipidemia     Not  on med tx for same Weight gain noted Check when fasting    Relevant Orders      TSH      Hemoglobin A1c      Lipid panel   Hypertension      BP Readings from Last 3 Encounters:  04/15/14 126/72  10/16/13 140/72  09/03/13 144/82   generallly well controlled on prior ACEI, but stopped 07/2013 by pulm due to new dx COPD and potential for cough. Started ARB 08/2013- reports generally improved The current medical regimen is effective;  continue present plan and medications.     Relevant Orders      Hemoglobin A1c      Lipid panel    Other Visit Diagnoses   Weight gain    -  Primary    Relevant Orders       TSH       Hemoglobin A1c    Other malaise and fatigue         Relevant Orders       Basic metabolic panel       TSH       CBC with Differential

## 2014-04-15 NOTE — Assessment & Plan Note (Signed)
Not on med tx for same Weight gain noted Check when fasting

## 2014-04-15 NOTE — Patient Instructions (Signed)
It was good to see you today.  We have reviewed your prior records including labs and tests today  Test(s) ordered today. Your results will be released to [facility] (or called to you) after review, usually within 72hours after test completion. If any changes need to be made, you will be notified at that same time.  Medications reviewed and updated, no changes recommended at this time  Please schedule followup in 6 months for semi annual labs and review, call sooner if problems.

## 2014-04-16 ENCOUNTER — Other Ambulatory Visit (INDEPENDENT_AMBULATORY_CARE_PROVIDER_SITE_OTHER): Payer: Medicare Other

## 2014-04-16 DIAGNOSIS — R635 Abnormal weight gain: Secondary | ICD-10-CM

## 2014-04-16 DIAGNOSIS — R5383 Other fatigue: Principal | ICD-10-CM

## 2014-04-16 DIAGNOSIS — I10 Essential (primary) hypertension: Secondary | ICD-10-CM

## 2014-04-16 DIAGNOSIS — J449 Chronic obstructive pulmonary disease, unspecified: Secondary | ICD-10-CM

## 2014-04-16 DIAGNOSIS — F411 Generalized anxiety disorder: Secondary | ICD-10-CM

## 2014-04-16 DIAGNOSIS — E785 Hyperlipidemia, unspecified: Secondary | ICD-10-CM

## 2014-04-16 DIAGNOSIS — R5381 Other malaise: Secondary | ICD-10-CM

## 2014-04-16 LAB — CBC WITH DIFFERENTIAL/PLATELET
Basophils Absolute: 0 10*3/uL (ref 0.0–0.1)
Basophils Relative: 0.4 % (ref 0.0–3.0)
EOS PCT: 1.9 % (ref 0.0–5.0)
Eosinophils Absolute: 0.1 10*3/uL (ref 0.0–0.7)
HCT: 41.7 % (ref 36.0–46.0)
Hemoglobin: 13.9 g/dL (ref 12.0–15.0)
LYMPHS PCT: 34.4 % (ref 12.0–46.0)
Lymphs Abs: 1.8 10*3/uL (ref 0.7–4.0)
MCHC: 33.3 g/dL (ref 30.0–36.0)
MCV: 92 fl (ref 78.0–100.0)
Monocytes Absolute: 0.5 10*3/uL (ref 0.1–1.0)
Monocytes Relative: 8.8 % (ref 3.0–12.0)
NEUTROS PCT: 54.5 % (ref 43.0–77.0)
Neutro Abs: 2.8 10*3/uL (ref 1.4–7.7)
Platelets: 252 10*3/uL (ref 150.0–400.0)
RBC: 4.53 Mil/uL (ref 3.87–5.11)
RDW: 15.5 % (ref 11.5–15.5)
WBC: 5.1 10*3/uL (ref 4.0–10.5)

## 2014-04-16 LAB — LIPID PANEL
CHOLESTEROL: 184 mg/dL (ref 0–200)
HDL: 52.4 mg/dL (ref >39.00)
LDL CALC: 119 mg/dL — AB (ref 0–99)
NonHDL: 131.6
TRIGLYCERIDES: 64 mg/dL (ref 0.0–149.0)
Total CHOL/HDL Ratio: 4
VLDL: 12.8 mg/dL (ref 0.0–40.0)

## 2014-04-16 LAB — HEMOGLOBIN A1C: HEMOGLOBIN A1C: 6.2 % (ref 4.6–6.5)

## 2014-04-16 LAB — TSH: TSH: 1.67 u[IU]/mL (ref 0.35–4.50)

## 2014-04-16 LAB — BASIC METABOLIC PANEL
BUN: 21 mg/dL (ref 6–23)
CO2: 29 mEq/L (ref 19–32)
Calcium: 8.9 mg/dL (ref 8.4–10.5)
Chloride: 106 mEq/L (ref 96–112)
Creatinine, Ser: 0.7 mg/dL (ref 0.4–1.2)
GFR: 83.95 mL/min (ref >60.00)
Glucose, Bld: 94 mg/dL (ref 70–99)
POTASSIUM: 4.4 meq/L (ref 3.5–5.1)
Sodium: 140 mEq/L (ref 135–145)

## 2014-05-14 ENCOUNTER — Telehealth: Payer: Self-pay

## 2014-05-14 NOTE — Telephone Encounter (Signed)
LVM for pt to call back... Regarding scheduling for AWV

## 2014-06-29 ENCOUNTER — Ambulatory Visit (INDEPENDENT_AMBULATORY_CARE_PROVIDER_SITE_OTHER): Payer: Medicare Other | Admitting: *Deleted

## 2014-06-29 DIAGNOSIS — Z23 Encounter for immunization: Secondary | ICD-10-CM

## 2014-08-29 ENCOUNTER — Other Ambulatory Visit: Payer: Self-pay | Admitting: Internal Medicine

## 2014-09-07 ENCOUNTER — Other Ambulatory Visit: Payer: Self-pay | Admitting: Internal Medicine

## 2014-10-06 ENCOUNTER — Other Ambulatory Visit: Payer: Self-pay | Admitting: Internal Medicine

## 2014-10-07 ENCOUNTER — Telehealth: Payer: Self-pay | Admitting: Internal Medicine

## 2014-10-07 MED ORDER — FLUTICASONE-SALMETEROL 250-50 MCG/DOSE IN AEPB: INHALATION_SPRAY | RESPIRATORY_TRACT | Status: DC

## 2014-10-07 MED ORDER — TIOTROPIUM BROMIDE MONOHYDRATE 18 MCG IN CAPS: ORAL_CAPSULE | RESPIRATORY_TRACT | Status: DC

## 2014-10-07 NOTE — Telephone Encounter (Signed)
Pt is aware at her 09-03-13 visit with MW he requested she return in 3 months for OV; she thought when he said they were done at the visit that it meant she did not have to return for appts with MW. Pt is aware that he follows her for her breathing concerns and needs to have OV's accordingly to give the best treatment possible. Pt is scheduled to see MW on 10-14-14 at 9:15am and aware I have sent 1 month supply of each to pharmacy (confirmed) on file. Nothing more needed at this time.

## 2014-10-14 ENCOUNTER — Ambulatory Visit (INDEPENDENT_AMBULATORY_CARE_PROVIDER_SITE_OTHER): Payer: Medicare Other | Admitting: Internal Medicine

## 2014-10-14 ENCOUNTER — Encounter: Payer: Self-pay | Admitting: Internal Medicine

## 2014-10-14 ENCOUNTER — Ambulatory Visit (INDEPENDENT_AMBULATORY_CARE_PROVIDER_SITE_OTHER)
Admission: RE | Admit: 2014-10-14 | Discharge: 2014-10-14 | Disposition: A | Discharge status: Home / Self Care | Payer: Medicare Other | Source: Ambulatory Visit | Attending: Internal Medicine | Admitting: Internal Medicine

## 2014-10-14 VITALS — BP 152/84 | HR 90 | Ht 65.0 in | Wt 201.4 lb

## 2014-10-14 VITALS — BP 138/84 | HR 88 | Temp 98.0°F | Ht 65.0 in | Wt 201.4 lb

## 2014-10-14 DIAGNOSIS — J449 Chronic obstructive pulmonary disease, unspecified: Secondary | ICD-10-CM

## 2014-10-14 DIAGNOSIS — I10 Essential (primary) hypertension: Secondary | ICD-10-CM | POA: Diagnosis not present

## 2014-10-14 DIAGNOSIS — E669 Obesity, unspecified: Secondary | ICD-10-CM | POA: Insufficient documentation

## 2014-10-14 DIAGNOSIS — F411 Generalized anxiety disorder: Secondary | ICD-10-CM

## 2014-10-14 DIAGNOSIS — M810 Age-related osteoporosis without current pathological fracture: Secondary | ICD-10-CM

## 2014-10-14 DIAGNOSIS — Z1211 Encounter for screening for malignant neoplasm of colon: Secondary | ICD-10-CM

## 2014-10-14 DIAGNOSIS — Z23 Encounter for immunization: Secondary | ICD-10-CM | POA: Diagnosis not present

## 2014-10-14 MED ORDER — CALCIUM CARBONATE 600 MG PO TABS: 600.0000 mg | ORAL_TABLET | Freq: Two times a day (BID) | ORAL | Status: DC

## 2014-10-14 MED ORDER — ALENDRONATE SODIUM 70 MG PO TABS: 70.0000 mg | ORAL_TABLET | ORAL | Status: DC

## 2014-10-14 MED ORDER — VITAMIN D 1000 UNITS PO TABS: 1000.0000 [IU] | ORAL_TABLET | Freq: Every day | ORAL | Status: AC

## 2014-10-14 MED ORDER — ASPIRIN 81 MG PO TBEC: 81.0000 mg | DELAYED_RELEASE_TABLET | ORAL | Status: AC

## 2014-10-14 MED ORDER — FLUTICASONE-SALMETEROL 250-50 MCG/DOSE IN AEPB
1.0000 | INHALATION_SPRAY | Freq: Two times a day (BID) | RESPIRATORY_TRACT | Status: DC
Start: 2014-10-14 — End: 2015-03-24

## 2014-10-14 MED ORDER — BUSPIRONE HCL 5 MG PO TABS: 5.0000 mg | ORAL_TABLET | Freq: Three times a day (TID) | ORAL | Status: DC | PRN

## 2014-10-14 MED ORDER — TIOTROPIUM BROMIDE MONOHYDRATE 18 MCG IN CAPS: 18.0000 ug | ORAL_CAPSULE | Freq: Every day | RESPIRATORY_TRACT | Status: DC

## 2014-10-14 NOTE — Patient Instructions (Signed)
Please remember to go to the   x-ray department downstairs for your tests - we will call you with the results when they are available.   If you are satisfied with your treatment plan,  let your doctor know and he/she can either refill your medications or you can return here when your prescription runs out.     If in any way you are not 100% satisfied,  please tell us.  If 100% better, tell your friends!  Pulmonary follow up is as needed        

## 2014-10-14 NOTE — Assessment & Plan Note (Signed)
Chronic hypoxic resp failure - initially dx during exacerbation of same with cor pulmonale 07/2013 hosp O2 dep, qhs and prn spiriva added to advair early 08/2013 - symptoms much improved Reports rare use of Alb MDI  Reportedly, has been released by pulmonary to follow with PCP alone as no acute flares in over one year The current medical regimen is effective;  continue present plan and medications.

## 2014-10-14 NOTE — Assessment & Plan Note (Signed)
Largely situational Denies si/hi or overwhelming depression symptoms (but overlaps with same) Started SSRI 07/2013 during hosp for COPD flare - but indep stopped same and does not wish to resume daily therapy Also uses prn buspar in place of prn "BZ" as pt wishes to avoid potentially habit forming meds -will refill same today at patient request given seasonal exacerbation of chronic symptoms

## 2014-10-14 NOTE — Assessment & Plan Note (Signed)
-   added spiriva 08/03/2013 > improved - PFT's 09/03/2013 FEV1 0.86 (36%) ratio 47 and 14% p B2 and DLCO 45 with correction to 55 % -  10/14/2014 p extensive coaching HFA effectiveness =   75%  She is severe copd but very sedentary and now that she has quit smoking she is relatively stable on laba/lama/ics s limiting sob or tendency to exac so no need for pulmonary f/u      Each maintenance medication was reviewed in detail including most importantly the difference between maintenance and as needed and under what circumstances the prns are to be used.  Please see instructions for details which were reviewed in writing and the patient given a copy.    The proper method of use, as well as anticipated side effects, of a metered-dose inhaler are discussed and demonstrated to the patient. Improved effectiveness after extensive coaching during this visit to a level of approximately  75% for hfa/ 100% on dpi

## 2014-10-14 NOTE — Progress Notes (Signed)
Subjective:    Patient ID: Morgan [name], female    DOB: 1946-09-03, 69 y.o.   MRN: [id number]  HPI  Patient is here for follow up  Reviewed chronic medical issues and interval medical events  Past Medical History  Diagnosis Date  . Asthma   . Goiter   . Hemorrhoids   . COPD (chronic obstructive pulmonary disease)     chronic hypoxic resp failure, O2 dep since 07/2013 hosp  . Hypertension   . Hyperlipidemia   . Psoriasis   . Anxiety   . Osteoporosis 04/11/2014    DEXA @ [facility] 03/2014: -3.0    Review of Systems  Constitutional: Positive for fatigue and unexpected weight change (continued weight gain). Negative for fever.  Respiratory: Negative for cough and shortness of breath.   Psychiatric/Behavioral: Positive for dysphoric mood. The patient is nervous/anxious.        Objective:   Physical Exam  BP 138/84 mmHg  Pulse 88  Temp(Src) 98 F (36.7 C) (Oral)  Ht 5\' 5"  (1.651 m)  Wt 201 lb 6 oz (91.343 kg)  BMI 33.51 kg/m2  SpO2 94% Wt Readings from Last 3 Encounters:  10/14/14 201 lb 6 oz (91.343 kg)  10/14/14 201 lb 6.4 oz (91.354 kg)  04/15/14 185 lb 12.8 oz (84.278 kg)   Constitutional: She is overweight, appears well-developed and well-nourished. No distress.  Neck: Normal range of motion. Neck supple. No JVD present. No thyromegaly present.  Cardiovascular: Normal rate, regular rhythm and normal heart sounds.  No murmur heard. No BLE edema. Pulmonary/Chest: Effort normal and breath sounds normal. No respiratory distress. She has no wheezes.  Psychiatric: She has a normal mood and affect. Her behavior is normal. Judgment and thought content normal.   Lab Results  Component Value Date   WBC 5.1 04/16/2014   HGB 13.9 04/16/2014   HCT 41.7 04/16/2014   PLT 252.0 04/16/2014   GLUCOSE 94 04/16/2014   CHOL 184 04/16/2014   TRIG 64.0 04/16/2014   HDL 52.40 04/16/2014   LDLCALC 119* 04/16/2014   ALT 19 12/11/2012   AST 13 12/11/2012   NA 140 04/16/2014   K 4.4  04/16/2014   CL 106 04/16/2014   CREATININE 0.7 04/16/2014   BUN 21 04/16/2014   CO2 29 04/16/2014   TSH 1.67 04/16/2014   INR 1.06 07/24/2013   HGBA1C 6.2 04/16/2014    Dg Chest 2 View  10/14/2014   CLINICAL DATA:  Hypertension.  COPD.  EXAM: CHEST  2 VIEW  COMPARISON:  Chest x-ray 07/28/2013, 07/2019 11/2012. CT chest 07/26/2013.  FINDINGS: Mediastinum hilar structures normal. Bilateral mild interstitial prominence is with chronic interstitial lung disease. No pleural effusion or pneumothorax. COPD cannot be excluded. Basilar pleural parenchymal thickening and consistent with scarring. Degenerative changes thoracic spine.  IMPRESSION: Chronic interstitial lung disease and COPD. Basal pleural parenchymal scarring. No acute cardiopulmonary disease identified.   Electronically Signed   By: Marcello Moores  Register   On: 10/14/2014 09:59       Assessment & Plan:   Problem List Items Addressed This Visit    COPD GOLD III wih min reversibility - Primary    Chronic hypoxic resp failure - initially dx during exacerbation of same with cor pulmonale 07/2013 hosp O2 dep, qhs and prn spiriva added to advair early 08/2013 - symptoms much improved Reports rare use of Alb MDI  Reportedly, has been released by pulmonary to follow with PCP alone as no acute flares in over one year The  current medical regimen is effective;  continue present plan and medications.      Relevant Medications   Fluticasone-Salmeterol (ADVAIR DISKUS) 250-50 MCG/DOSE AEPB   tiotropium (SPIRIVA HANDIHALER) 18 MCG inhalation capsule   GAD (generalized anxiety disorder)    Largely situational Denies si/hi or overwhelming depression symptoms (but overlaps with same) Started SSRI 07/2013 during hosp for COPD flare - but indep stopped same and does not wish to resume daily therapy Also uses prn buspar in place of prn "BZ" as pt wishes to avoid potentially habit forming meds -will refill same today at patient request given seasonal  exacerbation of chronic symptoms      Hypertension    BP Readings from Last 3 Encounters:  10/14/14 138/84  10/14/14 152/84  04/15/14 126/72   generallly well controlled on prior ACEI, but stopped 07/2013 by pulm due to new dx COPD and potential for cough. Started ARB 08/2013- reports generally improved The current medical regimen is effective;  continue present plan and medications.       Relevant Medications   aspirin 81 MG EC tablet   Obese    Wt Readings from Last 3 Encounters:  10/14/14 201 lb 6 oz (91.343 kg)  10/14/14 201 lb 6.4 oz (91.354 kg)  04/15/14 185 lb 12.8 oz (84.278 kg)   Reviewed 50 pound weight gain since smoking cessation December 2014 The patient is asked to make an attempt to improve diet and exercise patterns to aid in medical management of this problem.       Osteoporosis    Has recently decreased Fosamax use to every other week rather than weekly because of concerns of bone pain related to same (right anterior femur symptoms which have resolved since medication dose change) Will consider alternate therapy if recurrent right femur symptoms on every other week therapy Continue calcium with vitamin D and weightbearing exercise as tolerated. Also discussed potential of lumbar source for anterior thigh pain, especially considering 50 pound weight gain      Relevant Medications   calcium carbonate (CALCIUM 600) 600 MG TABS tablet   cholecalciferol (VITAMIN D) tablet   alendronate (FOSAMAX) 70 MG tablet    Other Visit Diagnoses    Need for prophylactic vaccination against Streptococcus pneumoniae (pneumococcus)        Relevant Orders    Pneumococcal conjugate vaccine 13-valent (Completed)

## 2014-10-14 NOTE — Assessment & Plan Note (Signed)
Wt Readings from Last 3 Encounters:  10/14/14 201 lb 6 oz (91.343 kg)  10/14/14 201 lb 6.4 oz (91.354 kg)  04/15/14 185 lb 12.8 oz (84.278 kg)   Reviewed 50 pound weight gain since smoking cessation December 2014 The patient is asked to make an attempt to improve diet and exercise patterns to aid in medical management of this problem.

## 2014-10-14 NOTE — Patient Instructions (Addendum)
It was good to see you today.  We have reviewed your prior records including labs and tests today  Prevnar 13 updated today   Medications reviewed and updated, no changes recommended at this time. Refill on medication(s) as discussed today.  Please schedule followup in 6 months for annual physical exam and labs, call sooner if problems. (If insurance covers wellness, not physical - do NOT do labs ahead of visit)

## 2014-10-14 NOTE — Progress Notes (Signed)
Subjective:     Patient ID: Morgan [name], female   DOB: [birth year].   MRN: [id number]   Brief patient profile:   66 yowf quit smoking 07/15/13 with documented GOLD III COPD 09/03/13 referred to pulmonary clinic p admit:  Date of Admission: 07/23/2013  Date of Discharge: 07/29/2013    History of Present Illness   PULMONARY  Acute respiratory failure  COPD  Active smoker  P:  Ok off bipap x 24h, ok for floor  O2 to keep sats 88-92%  BD  Systemic steroids  Smoking cessation  Will need outpt pulmonary f/u   CARDIOVASCULAR  Hx HTN  CAD  P:  Monitor  Holding home ACE> not a good choice for a pt with unstable airway issues so rec permanently d/c   RENAL  Hyponatremia - mild  P:  F/u chem  Gentle fluids    HEMATOLOGIC  Polycythemia, resolved       08/03/2013 1st [facility] office visit/post hosp f/u/  Morgan [name] moved from [facility] April on advair 500  with saba daily only "when overdid it" eg with steps but able to walk at mall slowly and across parking lot but stopped smoking on admit p acute decomp in setting of  URI  rx with 02 / nebs/abs/last prednisone 08/02/13 rec Start spiriva one capsule each am Continue advair twice daily  Work on inhaler technique:   Only use your albuterol as a rescue medication r spare tire for your car)  Reduce 02 to 2lpm 24/ 7    09/03/2013 f/u ov/Morgan [name] re: COPD III/ 02 dep Chief Complaint  Patient presents with  . Followup with PFT    Pt states her breathing is doing well.  Has not had to use rescue inhaler. No new co's today.   on advair 500/ spiriva thinks cozar is making her cough = dry, mostly daytime, assoc with mild haorseness.  Not limited from very low lever adls by sob rec   change to advair 250/50 one twice daily  Work on inhaler technique:     10/14/2014 f/u ov/Morgan [name] re: copd GOLD III/ 02 dep on advair/ spirva  Chief Complaint  Patient presents with  . Follow-up    Pt states that her breathing is overall doing well. She  has good days and bad days- worse with cold weather. She is using rescue inhaler about 4 x per wk.    Not limited by breathing from desired activities  But very sedentary     No obvious other patterns in  day to day or daytime variabilty or assoc cough/ cp or chest tightness, subjective wheeze overt sinus or hb symptoms. No unusual exp hx or h/o childhood pna/ asthma or knowledge of premature birth.  Sleeping ok without nocturnal  or early am exacerbation  of respiratory  c/o's or need for noct saba. Also denies any obvious fluctuation of symptoms with weather or environmental changes or other aggravating or alleviating factors except as outlined above   Current Medications, Allergies, Complete Past Medical History, Past Surgical History, Family History, and Social History were reviewed in [employer] record.  ROS  The following are not active complaints unless bolded sore throat, dysphagia, dental problems, itching, sneezing,  nasal congestion or excess/ purulent secretions, ear ache,   fever, chills, sweats, unintended wt loss, pleuritic or exertional cp, hemoptysis,  orthopnea pnd or leg swelling, presyncope, palpitations, heartburn, abdominal pain, anorexia, nausea, vomiting, diarrhea  or change in bowel or urinary habits, change in stools  or urine, dysuria,hematuria,  rash, arthralgias, visual complaints, headache, numbness weakness or ataxia or problems with walking or coordination,  change in mood/affect or memory.               Objective:   Physical Exam  Pleasant amb wf     10/14/2014        201  Wt Readings from Last 3 Encounters:  09/03/13 160 lb (72.576 kg)  08/21/13 156 lb 12.8 oz (71.124 kg)  08/03/13 157 lb (71.215 kg)       HEENT mild turbinate edema.  Oropharynx no thrush or excess pnd or cobblestoning.  No JVD or cervical adenopathy. Mild accessory muscle hypertrophy. Trachea midline, nl thryroid. Chest was hyperinflated by percussion with  diminished breath sounds and moderate increased exp time without wheeze. Hoover sign positive at mid inspiration. Regular rate and rhythm without murmur gallop or rub or increase P2 or edema.  Abd: no hsm, nl excursion. Ext warm without cyanosis or clubbing.       CXR PA and Lateral:   10/14/2014 :     I personally reviewed images and agree with radiology impression as follows:     Chronic interstitial lung disease and COPD. Basal pleural parenchymal scarring. No acute cardiopulmonary disease identified. Assessment:

## 2014-10-14 NOTE — Assessment & Plan Note (Signed)
Has recently decreased Fosamax use to every other week rather than weekly because of concerns of bone pain related to same (right anterior femur symptoms which have resolved since medication dose change) Will consider alternate therapy if recurrent right femur symptoms on every other week therapy Continue calcium with vitamin D and weightbearing exercise as tolerated. Also discussed potential of lumbar source for anterior thigh pain, especially considering 50 pound weight gain

## 2014-10-14 NOTE — Assessment & Plan Note (Signed)
BP Readings from Last 3 Encounters:  10/14/14 138/84  10/14/14 152/84  04/15/14 126/72   generallly well controlled on prior ACEI, but stopped 07/2013 by pulm due to new dx COPD and potential for cough. Started ARB 08/2013- reports generally improved The current medical regimen is effective;  continue present plan and medications.

## 2014-11-12 ENCOUNTER — Encounter: Payer: Self-pay | Admitting: Internal Medicine

## 2014-11-22 ENCOUNTER — Encounter: Payer: Self-pay | Admitting: Internal Medicine

## 2014-11-23 ENCOUNTER — Encounter: Payer: Self-pay | Admitting: Internal Medicine

## 2015-01-01 ENCOUNTER — Other Ambulatory Visit: Payer: Self-pay | Admitting: Internal Medicine

## 2015-01-04 ENCOUNTER — Other Ambulatory Visit: Payer: Self-pay

## 2015-01-04 MED ORDER — OMEPRAZOLE 20 MG PO CPDR: 20.0000 mg | DELAYED_RELEASE_CAPSULE | Freq: Every day | ORAL | Status: DC

## 2015-01-04 MED ORDER — BUSPIRONE HCL 5 MG PO TABS: 5.0000 mg | ORAL_TABLET | Freq: Three times a day (TID) | ORAL | Status: DC | PRN

## 2015-01-04 MED ORDER — LOSARTAN POTASSIUM 50 MG PO TABS: 50.0000 mg | ORAL_TABLET | Freq: Every day | ORAL | Status: DC

## 2015-01-19 ENCOUNTER — Encounter: Payer: Medicare Other | Admitting: Internal Medicine

## 2015-01-24 ENCOUNTER — Ambulatory Visit (AMBULATORY_SURGERY_CENTER): Payer: Self-pay | Admitting: *Deleted

## 2015-01-24 VITALS — Ht 65.0 in | Wt 202.0 lb

## 2015-01-24 DIAGNOSIS — Z1211 Encounter for screening for malignant neoplasm of colon: Secondary | ICD-10-CM

## 2015-01-24 NOTE — Progress Notes (Signed)
Patient denies any allergies to eggs or soy. Patient denies any problems with anesthesia/sedation. Patient denies any oxygen use at home, no O2 use since 2014 and does not take any diet/weight loss medications. No SOB noted today, patient denies any respiratory distress. Does use inhalers daily. EMMI education assisgned to patient on colonoscopy, this was explained and instructions given to patient.

## 2015-02-04 ENCOUNTER — Ambulatory Visit (AMBULATORY_SURGERY_CENTER): Payer: Medicare Other | Admitting: Internal Medicine

## 2015-02-04 ENCOUNTER — Other Ambulatory Visit: Payer: Self-pay | Admitting: Internal Medicine

## 2015-02-04 ENCOUNTER — Encounter: Payer: Self-pay | Admitting: Internal Medicine

## 2015-02-04 VITALS — BP 142/81 | HR 81 | Temp 97.3°F | Resp 19 | Ht 65.0 in | Wt 202.0 lb

## 2015-02-04 DIAGNOSIS — D124 Benign neoplasm of descending colon: Secondary | ICD-10-CM

## 2015-02-04 DIAGNOSIS — Z1211 Encounter for screening for malignant neoplasm of colon: Secondary | ICD-10-CM

## 2015-02-04 DIAGNOSIS — D125 Benign neoplasm of sigmoid colon: Secondary | ICD-10-CM | POA: Diagnosis not present

## 2015-02-04 MED ORDER — SODIUM CHLORIDE 0.9 % IV SOLN: 500.0000 mL | INTRAVENOUS | Status: DC

## 2015-02-04 NOTE — Progress Notes (Signed)
Called to room to assist during endoscopic procedure.  Patient ID and intended procedure confirmed with present staff. Received instructions for my participation in the procedure from the performing physician.  

## 2015-02-04 NOTE — Progress Notes (Signed)
To recovery, report to [place], RN, VSS.

## 2015-02-04 NOTE — Patient Instructions (Signed)
Multiple polyps removed and sent to pathology. Mild diverticulosis and hemorrhoids were also found  . Await pathology results for final recommendation.    YOU HAD AN ENDOSCOPIC PROCEDURE TODAY AT [facility]:   Refer to the procedure report that was given to you for any specific questions about what was found during the examination.  If the procedure report does not answer your questions, please call your gastroenterologist to clarify.  If you requested that your care partner not be given the details of your procedure findings, then the procedure report has been included in a sealed envelope for you to review at your convenience later.  YOU SHOULD EXPECT: Some feelings of bloating in the abdomen. Passage of more gas than usual.  Walking can help get rid of the air that was put into your GI tract during the procedure and reduce the bloating. If you had a lower endoscopy (such as a colonoscopy or flexible sigmoidoscopy) you may notice spotting of blood in your stool or on the toilet paper. If you underwent a bowel prep for your procedure, you may not have a normal bowel movement for a few days.  Please Note:  You might notice some irritation and congestion in your nose or some drainage.  This is from the oxygen used during your procedure.  There is no need for concern and it should clear up in a day or so.  SYMPTOMS TO REPORT IMMEDIATELY:   Following lower endoscopy (colonoscopy or flexible sigmoidoscopy):  Excessive amounts of blood in the stool  Significant tenderness or worsening of abdominal pains  Swelling of the abdomen that is new, acute  Fever of 100F or higher   For urgent or emergent issues, a gastroenterologist can be reached at any hour by calling [phone].   DIET: Your first meal following the procedure should be a small meal and then it is ok to progress to your normal diet. Heavy or fried foods are harder to digest and may make you feel nauseous or bloated.   Likewise, meals heavy in dairy and vegetables can increase bloating.  Drink plenty of fluids but you should avoid alcoholic beverages for 24 hours.  ACTIVITY:  You should plan to take it easy for the rest of today and you should NOT DRIVE or use heavy machinery until tomorrow (because of the sedation medicines used during the test).    FOLLOW UP: Our staff will call the number listed on your records the next business day following your procedure to check on you and address any questions or concerns that you may have regarding the information given to you following your procedure. If we do not reach you, we will leave a message.  However, if you are feeling well and you are not experiencing any problems, there is no need to return our call.  We will assume that you have returned to your regular daily activities without incident.  If any biopsies were taken you will be contacted by phone or by letter within the next 1-3 weeks.  Please call us at [phone] if you have not heard about the biopsies in 3 weeks.    SIGNATURES/CONFIDENTIALITY: You and/or your care partner have signed paperwork which will be entered into your electronic medical record.  These signatures attest to the fact that that the information above on your After Visit Summary has been reviewed and is understood.  Full responsibility of the confidentiality of this discharge information lies with you and/or your care-partner.

## 2015-02-04 NOTE — Op Note (Signed)
[place]  [address] [place], [id number]   COLONOSCOPY PROCEDURE REPORT  PATIENT: Morgan [name], Morgan [name]  MR#: [id number] BIRTHDATE: Apr 04, 1946 , 9  yrs. old GENDER: female ENDOSCOPIST: Lafayette Dragon, MD REFERRED HM:[id number] Asa Lente, M.D. PROCEDURE DATE:  02/04/2015 PROCEDURE:   Colonoscopy, screening, Colonoscopy with cold biopsy polypectomy, and Colonoscopy with snare polypectomy First Screening Colonoscopy - Avg.  risk and is 50 yrs.  old or older Yes.  Prior Negative Screening - Now for repeat screening. N/A  History of Adenoma - Now for follow-up colonoscopy & has been > or = to 3 yrs.  N/A  Polyps Removed Today ASA CLASS:   Class II INDICATIONS:Screening for colonic neoplasia and Colorectal Neoplasm Risk Assessment for this procedure is average risk. MEDICATIONS: Monitored anesthesia care and Propofol 300 mg IV  DESCRIPTION OF PROCEDURE:   After the risks benefits and alternatives of the procedure were thoroughly explained, informed consent was obtained.  The digital rectal exam revealed no abnormalities of the rectum.   The LB [id number]  endoscope was introduced through the anus and advanced to the cecum, which was identified by both the appendix and ileocecal valve. No adverse events experienced.   The quality of the prep was excellent. (MoviPrep was used)  The instrument was then slowly withdrawn as the colon was fully examined.      COLON FINDINGS: Seven sessile polyps were found in the sigmoid colon and descending colon.  A polypectomy was performed with cold forceps.  The resection was complete, the polyp tissue was completely retrieved and sent to histology.  A polypectomy was performed with a cold snare.  The resection was complete, the polyp tissue was completely retrieved and sent to histology.   There was mild diverticulosis noted in the sigmoid colon with associated angulation and tortuosity.   Small internal Grade I  hemorrhoids were found.  Retroflexed views revealed no abnormalities. The time to cecum = 5.30 Withdrawal time = 19.16   The scope was withdrawn and the procedure completed. COMPLICATIONS: There were no immediate complications.  ENDOSCOPIC IMPRESSION: 1.   Seven sessile polyps were found in the sigmoid colonx2  (10 mm and 5 mm) and descending colon;x5  (3-4 mm)polypectomy was performed with cold forceps; polypectomy was performed with a cold snare ( descending colon) 2.   There was mild diverticulosis noted in the sigmoid colon 3.   Small internal Grade I hemorrhoids  RECOMMENDATIONS: 1.  Await pathology results 2.  High fiber diet Recall colonoscopy pending path report  eSigned:  Lafayette Dragon, MD 02/04/2015 11:50 AM   cc:   PATIENT NAME:  Morgan [name], Morgan [name] MR#: [id number]

## 2015-02-07 ENCOUNTER — Telehealth: Payer: Self-pay | Admitting: *Deleted

## 2015-02-07 NOTE — Telephone Encounter (Signed)
  Follow up Call-  Call back number 02/04/2015  Post procedure Call Back phone  # [phone]  Permission to leave phone message Yes     No answer,left message.

## 2015-02-09 ENCOUNTER — Encounter: Payer: Self-pay | Admitting: Internal Medicine

## 2015-03-22 ENCOUNTER — Telehealth: Payer: Self-pay | Admitting: Internal Medicine

## 2015-03-22 NOTE — Telephone Encounter (Signed)
Patient states she is moving to [place].  She is requesting hand written scripts for all her meds and a rescue inhaler.

## 2015-03-24 MED ORDER — FLUTICASONE-SALMETEROL 250-50 MCG/DOSE IN AEPB: 1.0000 | INHALATION_SPRAY | Freq: Two times a day (BID) | RESPIRATORY_TRACT | Status: AC

## 2015-03-24 MED ORDER — LOSARTAN POTASSIUM 50 MG PO TABS: 50.0000 mg | ORAL_TABLET | Freq: Every day | ORAL | Status: AC

## 2015-03-24 MED ORDER — OMEPRAZOLE 20 MG PO CPDR: 20.0000 mg | DELAYED_RELEASE_CAPSULE | Freq: Every day | ORAL | Status: AC

## 2015-03-24 MED ORDER — TIOTROPIUM BROMIDE MONOHYDRATE 18 MCG IN CAPS: 18.0000 ug | ORAL_CAPSULE | Freq: Every day | RESPIRATORY_TRACT | Status: AC

## 2015-03-24 MED ORDER — ALBUTEROL SULFATE HFA 108 (90 BASE) MCG/ACT IN AERS: 2.0000 | INHALATION_SPRAY | RESPIRATORY_TRACT | Status: AC | PRN

## 2015-03-24 MED ORDER — CALCIUM CARBONATE 600 MG PO TABS: 600.0000 mg | ORAL_TABLET | Freq: Two times a day (BID) | ORAL | Status: AC

## 2015-03-24 MED ORDER — BUSPIRONE HCL 5 MG PO TABS: 5.0000 mg | ORAL_TABLET | Freq: Three times a day (TID) | ORAL | Status: AC | PRN

## 2015-03-24 NOTE — Telephone Encounter (Signed)
Printed rx done and left for PCP to sign.

## 2015-04-14 ENCOUNTER — Encounter: Payer: Medicare Other | Admitting: Internal Medicine

## 2015-06-21 IMAGING — CR DG CHEST 2V
2 series · 2 of 2 positions shown · non-contrast
Comparison: Chest x-ray 07/28/2013, [DATE] [DATE]. CT chest
07/26/2013.

CLINICAL DATA: Hypertension.  COPD.

EXAM:
CHEST  2 VIEW

[view not recorded (1 of 2)]
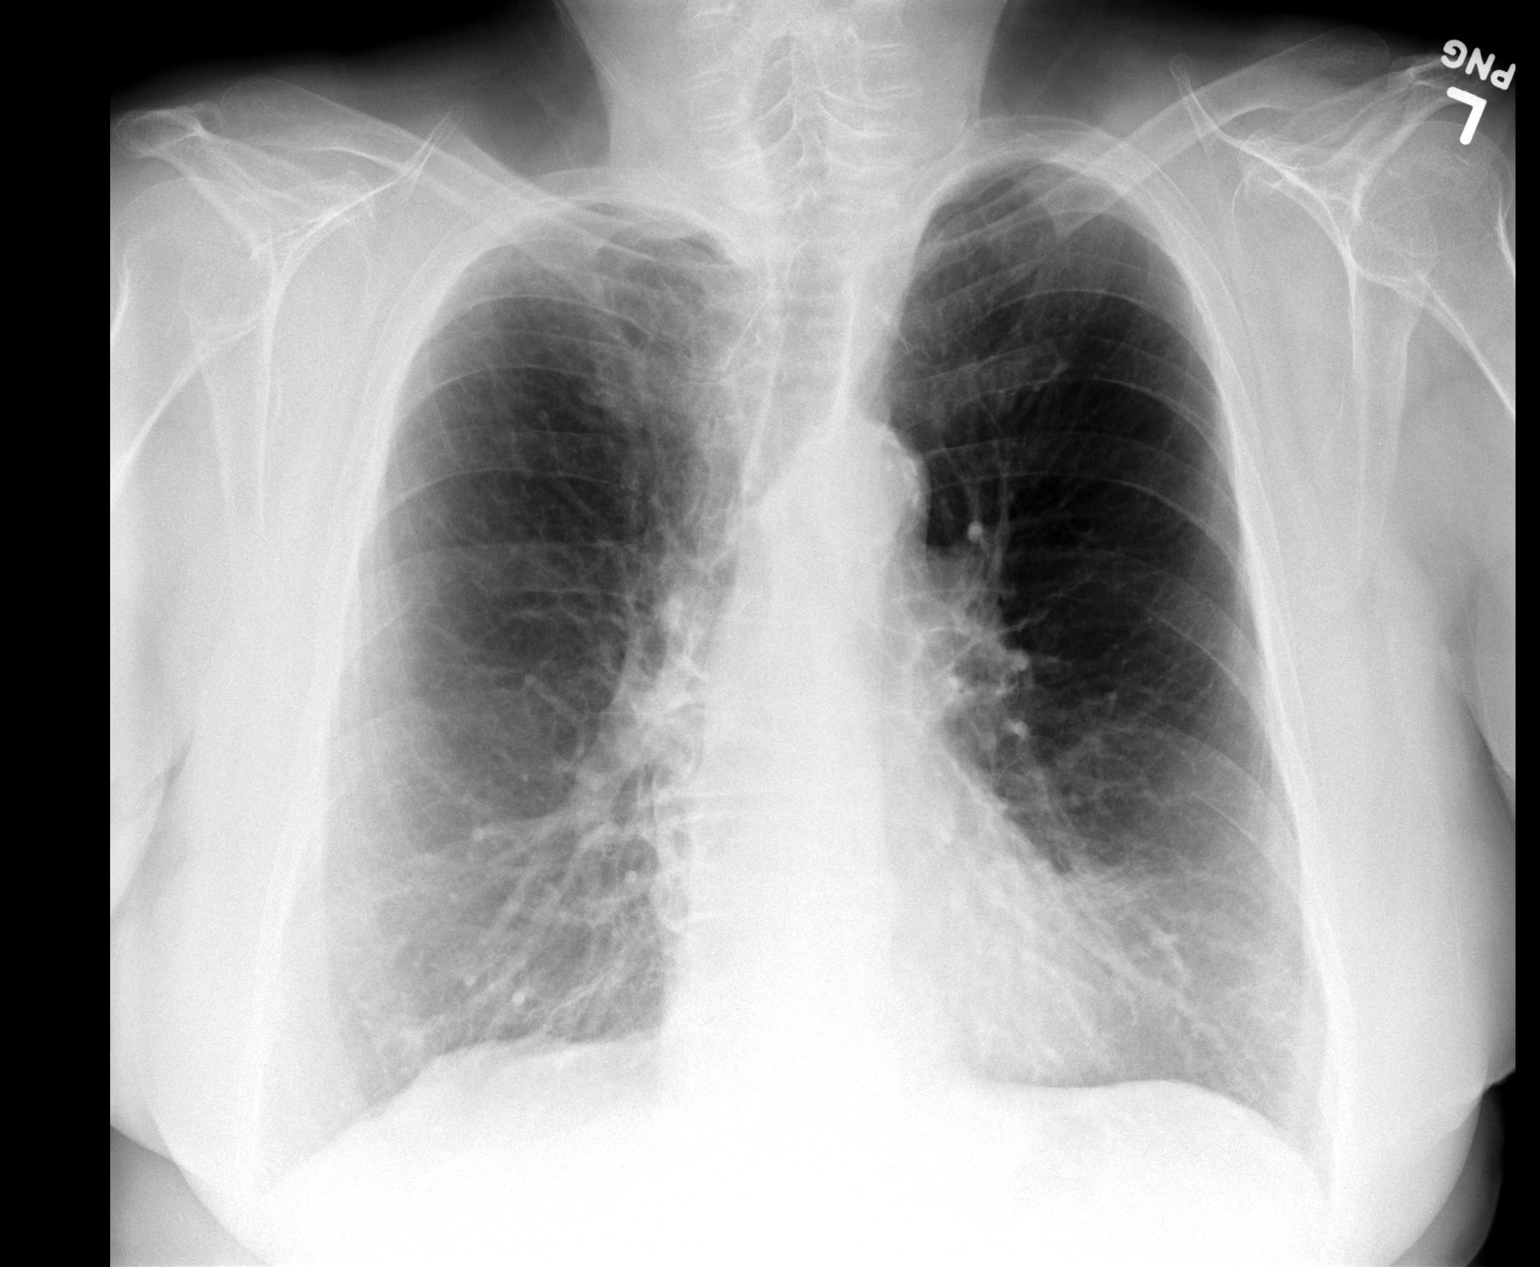

[view not recorded (2 of 2)]
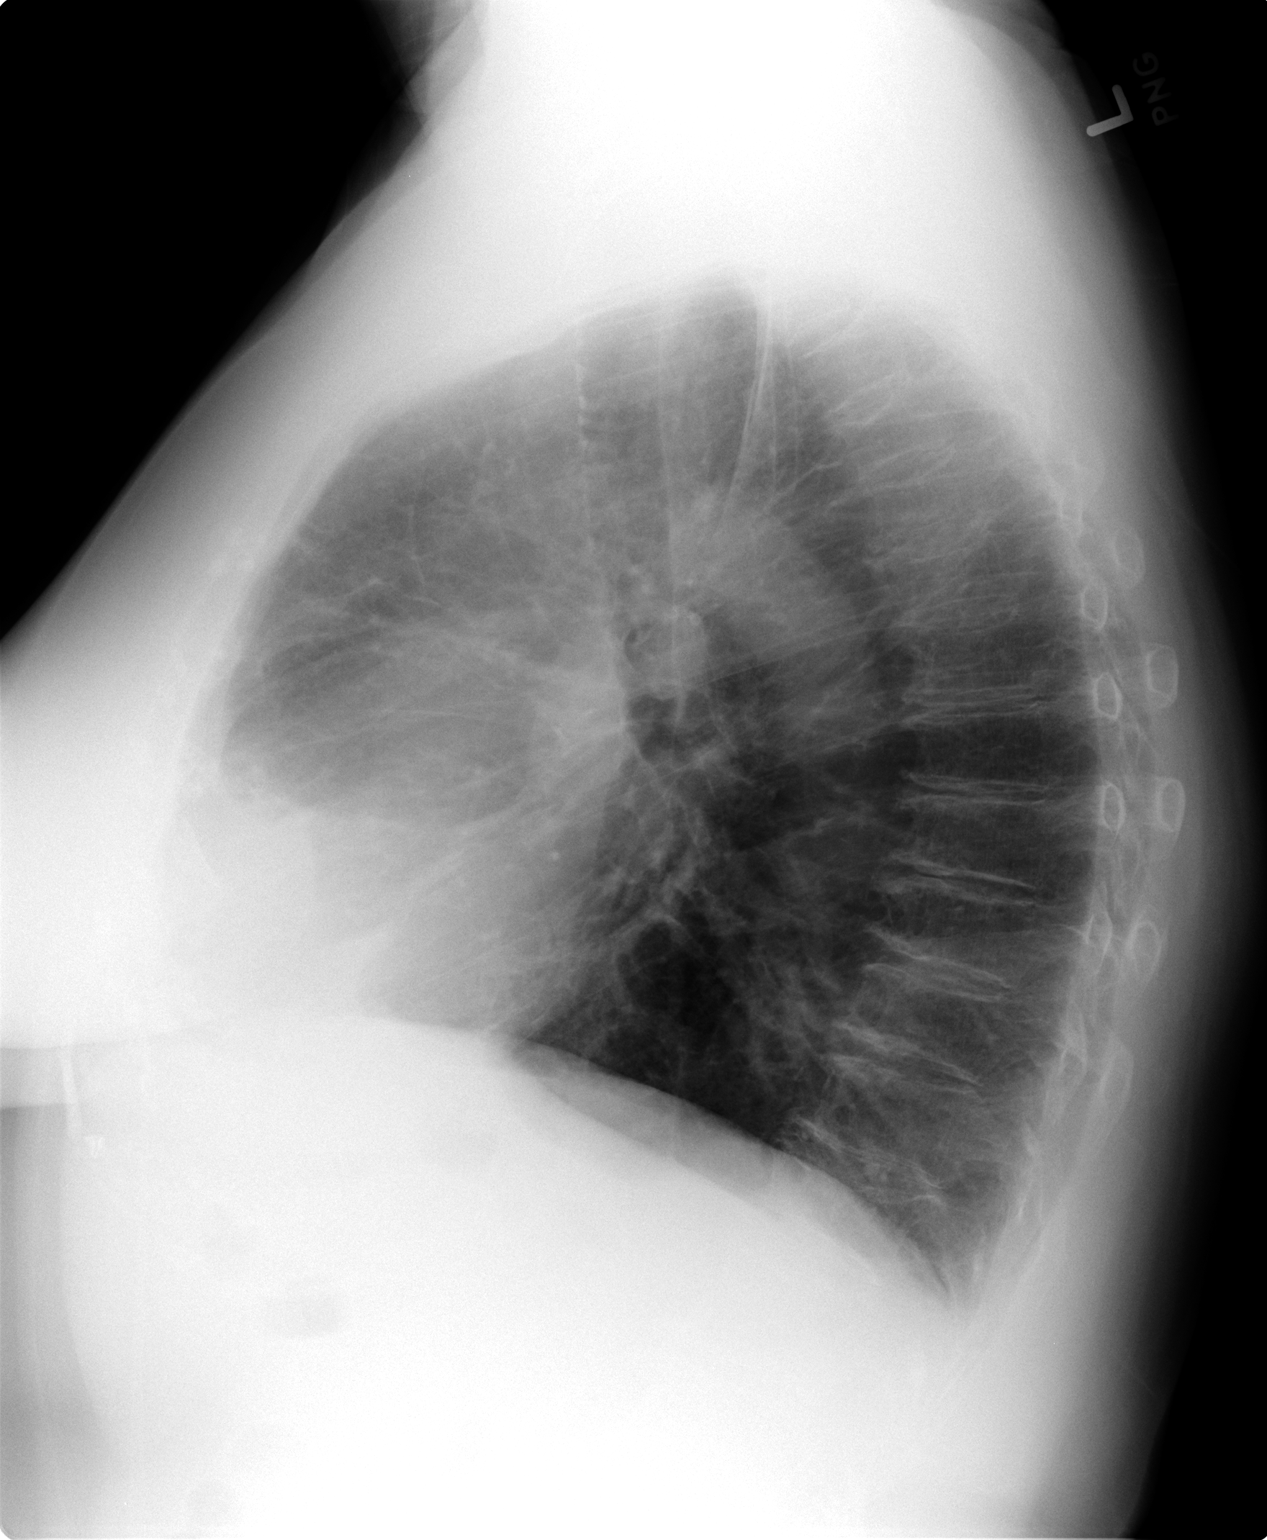

[2 of 2 positions shown; findings below may reference images not displayed]

FINDINGS: Mediastinum hilar structures normal. Bilateral mild interstitial
prominence is with chronic interstitial lung disease. No pleural
effusion or pneumothorax. COPD cannot be excluded. Basilar pleural
parenchymal thickening and consistent with scarring. Degenerative
changes thoracic spine.
IMPRESSION: Chronic interstitial lung disease and COPD. Basal pleural
parenchymal scarring. No acute cardiopulmonary disease identified.
